# Patient Record
Sex: Female | Born: 1985 | Race: Black or African American | Hispanic: No | Marital: Single | Smoking: Former smoker
Health system: Southern US, Community
[De-identification: ages and names within clinical notes are randomized; demographics above are authoritative.]

## PROBLEM LIST (undated history)

## (undated) ENCOUNTER — Inpatient Hospital Stay: Payer: Self-pay

## (undated) DIAGNOSIS — D649 Anemia, unspecified: Secondary | ICD-10-CM

## (undated) DIAGNOSIS — I639 Cerebral infarction, unspecified: Secondary | ICD-10-CM

## (undated) DIAGNOSIS — I10 Essential (primary) hypertension: Secondary | ICD-10-CM

## (undated) DIAGNOSIS — R42 Dizziness and giddiness: Secondary | ICD-10-CM

---
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
## 2006-05-03 ENCOUNTER — Emergency Department: Payer: Self-pay | Admitting: Emergency Medicine

## 2006-05-10 ENCOUNTER — Emergency Department: Payer: Self-pay | Admitting: Emergency Medicine

## 2006-11-10 ENCOUNTER — Emergency Department: Payer: Self-pay | Admitting: Emergency Medicine

## 2006-11-10 ENCOUNTER — Other Ambulatory Visit: Payer: Self-pay

## 2006-11-11 ENCOUNTER — Emergency Department: Payer: Self-pay | Admitting: Internal Medicine

## 2007-02-11 ENCOUNTER — Emergency Department: Payer: Self-pay | Admitting: Emergency Medicine

## 2007-03-23 ENCOUNTER — Ambulatory Visit: Payer: Self-pay | Admitting: Family Medicine

## 2007-06-30 ENCOUNTER — Observation Stay: Payer: Self-pay | Admitting: Obstetrics and Gynecology

## 2007-07-06 ENCOUNTER — Emergency Department: Payer: Self-pay | Admitting: Internal Medicine

## 2007-07-22 ENCOUNTER — Observation Stay: Payer: Self-pay

## 2007-07-29 ENCOUNTER — Inpatient Hospital Stay: Payer: Self-pay

## 2009-07-10 ENCOUNTER — Emergency Department: Payer: Self-pay | Admitting: Emergency Medicine

## 2012-02-04 ENCOUNTER — Emergency Department (HOSPITAL_COMMUNITY)
Admission: EM | Admit: 2012-02-04 | Discharge: 2012-02-04 | Disposition: A | Discharge status: Home / Self Care | Payer: Medicaid Other | Source: Home / Self Care | Attending: Family Medicine | Admitting: Family Medicine

## 2012-02-04 ENCOUNTER — Encounter (HOSPITAL_COMMUNITY): Payer: Self-pay

## 2012-02-04 DIAGNOSIS — L259 Unspecified contact dermatitis, unspecified cause: Secondary | ICD-10-CM

## 2012-02-04 MED ORDER — MOMETASONE FUROATE 0.1 % EX CREA: TOPICAL_CREAM | Freq: Every day | CUTANEOUS | Status: AC

## 2012-02-04 NOTE — ED Provider Notes (Signed)
History     CSN: [id number]  Arrival date & time 02/04/12  1617   First MD Initiated Contact with Patient 02/04/12 1656      Chief Complaint  Patient presents with  . Rash    (Consider location/radiation/quality/duration/timing/severity/associated sxs/prior treatment) Patient is a 26 y.o. female presenting with rash. The history is provided by the patient.  Rash  This is a new problem. The current episode started 2 days ago. The problem has not changed since onset.The problem is associated with an unknown factor. There has been no fever. The rash is present on the face. The patient is experiencing no pain. Associated symptoms include itching. She has tried nothing for the symptoms.    History reviewed. No pertinent past medical history.  History reviewed. No pertinent past surgical history.  No family history on file.  History  Substance Use Topics  . Smoking status: Current Everyday Smoker  . Smokeless tobacco: Not on file  . Alcohol Use: Yes    OB History    Grav Para Term Preterm Abortions TAB SAB Ect Mult Living                  Review of Systems  Constitutional: Negative.   HENT: Negative.   Skin: Positive for itching and rash.    Allergies  Review of patient's allergies indicates no known allergies.  Home Medications   Current Outpatient Rx  Name Route Sig Dispense Refill  . MOMETASONE FUROATE 0.1 % EX CREA Topical Apply topically daily. 15 g 1    BP 117/59  Pulse 85  Temp(Src) 98.7 F (37.1 C) (Oral)  Resp 18  SpO2 100%  Physical Exam  Nursing note and vitals reviewed. Constitutional: She is oriented to person, place, and time. She appears well-developed and well-nourished.  HENT:  Head: Normocephalic.  Right Ear: External ear normal.  Left Ear: External ear normal.  Nose: Nose normal.  Mouth/Throat: Oropharynx is clear and moist.  Eyes: Conjunctivae are normal. Pupils are equal, round, and reactive to light.  Neck: Normal range of  motion. Neck supple.  Lymphadenopathy:    She has no cervical adenopathy.  Neurological: She is alert and oriented to person, place, and time.  Skin: Rash noted.       Localized fine papular rash to mid forehead and cheeks, sparing remainder of face ,neck and rest of body.    ED Course  Procedures (including critical care time)  Labs Reviewed - No data to display No results found.   1. Contact dermatitis       MDM          Linna Hoff, MD 02/04/12 1843

## 2012-02-04 NOTE — ED Notes (Signed)
C/o itchy rash to face for 2 days. States she used a different soap this weekend.

## 2012-07-11 ENCOUNTER — Emergency Department: Payer: Self-pay | Admitting: Emergency Medicine

## 2012-07-11 LAB — URINALYSIS, COMPLETE
Bilirubin,UR: NEGATIVE
Blood: NEGATIVE
Glucose,UR: NEGATIVE mg/dL (ref 0–75)
Nitrite: NEGATIVE
Ph: 6 (ref 4.5–8.0)
Protein: 100
RBC,UR: NONE SEEN /HPF (ref 0–5)
Specific Gravity: 1.039 (ref 1.003–1.030)
Squamous Epithelial: 27
WBC UR: 4 /HPF (ref 0–5)

## 2012-07-11 LAB — PREGNANCY, URINE: Pregnancy Test, Urine: NEGATIVE m[IU]/mL

## 2012-07-12 LAB — URINE CULTURE

## 2013-11-06 ENCOUNTER — Emergency Department: Payer: Self-pay

## 2014-03-02 ENCOUNTER — Emergency Department: Payer: Self-pay | Admitting: Emergency Medicine

## 2014-03-02 LAB — CBC WITH DIFFERENTIAL/PLATELET
BASOS ABS: 0 10*3/uL (ref 0.0–0.1)
BASOS PCT: 0.5 %
EOS ABS: 0.1 10*3/uL (ref 0.0–0.7)
Eosinophil %: 1.3 %
HCT: 35.1 % (ref 35.0–47.0)
HGB: 11.7 g/dL — AB (ref 12.0–16.0)
LYMPHS PCT: 28 %
Lymphocyte #: 1.8 10*3/uL (ref 1.0–3.6)
MCH: 27.9 pg (ref 26.0–34.0)
MCHC: 33.4 g/dL (ref 32.0–36.0)
MCV: 84 fL (ref 80–100)
MONO ABS: 0.7 x10 3/mm (ref 0.2–0.9)
MONOS PCT: 11.2 %
NEUTROS PCT: 59 %
Neutrophil #: 3.7 10*3/uL (ref 1.4–6.5)
PLATELETS: 152 10*3/uL (ref 150–440)
RBC: 4.19 10*6/uL (ref 3.80–5.20)
RDW: 14.6 % — AB (ref 11.5–14.5)
WBC: 6.3 10*3/uL (ref 3.6–11.0)

## 2014-03-02 LAB — URINALYSIS, COMPLETE
Bacteria: NONE SEEN
Bilirubin,UR: NEGATIVE
Glucose,UR: NEGATIVE mg/dL (ref 0–75)
Ketone: NEGATIVE
LEUKOCYTE ESTERASE: NEGATIVE
NITRITE: NEGATIVE
PH: 6 (ref 4.5–8.0)
PROTEIN: NEGATIVE
Specific Gravity: 1.021 (ref 1.003–1.030)

## 2014-03-02 LAB — COMPREHENSIVE METABOLIC PANEL
ALBUMIN: 3.3 g/dL — AB (ref 3.4–5.0)
ALK PHOS: 63 U/L
ALT: 14 U/L (ref 12–78)
ANION GAP: 2 — AB (ref 7–16)
BILIRUBIN TOTAL: 0.2 mg/dL (ref 0.2–1.0)
BUN: 12 mg/dL (ref 7–18)
CALCIUM: 8.5 mg/dL (ref 8.5–10.1)
CREATININE: 1.03 mg/dL (ref 0.60–1.30)
Chloride: 106 mmol/L (ref 98–107)
Co2: 27 mmol/L (ref 21–32)
EGFR (Non-African Amer.): >60
Glucose: 86 mg/dL (ref 65–99)
Osmolality: 269 (ref 275–301)
Potassium: 3.6 mmol/L (ref 3.5–5.1)
SGOT(AST): 21 U/L (ref 15–37)
Sodium: 135 mmol/L — ABNORMAL LOW (ref 136–145)
Total Protein: 7.3 g/dL (ref 6.4–8.2)

## 2015-10-26 ENCOUNTER — Emergency Department
Admission: EM | Admit: 2015-10-26 | Discharge: 2015-10-26 | Disposition: A | Discharge status: Home / Self Care | Payer: Self-pay | Attending: Emergency Medicine | Admitting: Emergency Medicine

## 2015-10-26 ENCOUNTER — Emergency Department: Payer: Self-pay

## 2015-10-26 ENCOUNTER — Encounter: Payer: Self-pay | Admitting: Emergency Medicine

## 2015-10-26 ENCOUNTER — Emergency Department: Payer: Medicaid Other

## 2015-10-26 DIAGNOSIS — F17200 Nicotine dependence, unspecified, uncomplicated: Secondary | ICD-10-CM | POA: Insufficient documentation

## 2015-10-26 DIAGNOSIS — R42 Dizziness and giddiness: Secondary | ICD-10-CM | POA: Insufficient documentation

## 2015-10-26 DIAGNOSIS — I10 Essential (primary) hypertension: Secondary | ICD-10-CM | POA: Insufficient documentation

## 2015-10-26 DIAGNOSIS — Z3202 Encounter for pregnancy test, result negative: Secondary | ICD-10-CM | POA: Insufficient documentation

## 2015-10-26 HISTORY — DX: Cerebral infarction, unspecified: I63.9

## 2015-10-26 HISTORY — DX: Anemia, unspecified: D64.9

## 2015-10-26 HISTORY — DX: Essential (primary) hypertension: I10

## 2015-10-26 LAB — COMPREHENSIVE METABOLIC PANEL
ALT: 17 U/L (ref 14–54)
AST: 18 U/L (ref 15–41)
Albumin: 4.1 g/dL (ref 3.5–5.0)
Alkaline Phosphatase: 52 U/L (ref 38–126)
Anion gap: 4 — ABNORMAL LOW (ref 5–15)
BUN: 6 mg/dL (ref 6–20)
CHLORIDE: 108 mmol/L (ref 101–111)
CO2: 27 mmol/L (ref 22–32)
CREATININE: 0.77 mg/dL (ref 0.44–1.00)
Calcium: 9 mg/dL (ref 8.9–10.3)
GFR calc Af Amer: >60 mL/min (ref >60)
GFR calc non Af Amer: >60 mL/min (ref >60)
Glucose, Bld: 96 mg/dL (ref 65–99)
Potassium: 4.1 mmol/L (ref 3.5–5.1)
SODIUM: 139 mmol/L (ref 135–145)
Total Bilirubin: 0.3 mg/dL (ref 0.3–1.2)
Total Protein: 7.8 g/dL (ref 6.5–8.1)

## 2015-10-26 LAB — CBC WITH DIFFERENTIAL/PLATELET
Basophils Absolute: 0 10*3/uL (ref 0–0.1)
Basophils Relative: 1 %
Eosinophils Absolute: 0 10*3/uL (ref 0–0.7)
Eosinophils Relative: 1 %
HCT: 38.9 % (ref 35.0–47.0)
Hemoglobin: 12.7 g/dL (ref 12.0–16.0)
Lymphocytes Relative: 33 %
Lymphs Abs: 1.4 10*3/uL (ref 1.0–3.6)
MCH: 27.5 pg (ref 26.0–34.0)
MCHC: 32.6 g/dL (ref 32.0–36.0)
MCV: 84.2 fL (ref 80.0–100.0)
Monocytes Absolute: 0.4 10*3/uL (ref 0.2–0.9)
Monocytes Relative: 10 %
Neutro Abs: 2.3 10*3/uL (ref 1.4–6.5)
Neutrophils Relative %: 55 %
Platelets: 192 10*3/uL (ref 150–440)
RBC: 4.62 MIL/uL (ref 3.80–5.20)
RDW: 14.8 % — ABNORMAL HIGH (ref 11.5–14.5)
WBC: 4.2 10*3/uL (ref 3.6–11.0)

## 2015-10-26 LAB — URINE DRUG SCREEN, QUALITATIVE (ARMC ONLY)
Amphetamines, Ur Screen: NOT DETECTED
BARBITURATES, UR SCREEN: NOT DETECTED
Benzodiazepine, Ur Scrn: NOT DETECTED
COCAINE METABOLITE, UR ~~LOC~~: NOT DETECTED
Cannabinoid 50 Ng, Ur ~~LOC~~: NOT DETECTED
MDMA (Ecstasy)Ur Screen: NOT DETECTED
METHADONE SCREEN, URINE: NOT DETECTED
OPIATE, UR SCREEN: NOT DETECTED
Phencyclidine (PCP) Ur S: NOT DETECTED
Tricyclic, Ur Screen: NOT DETECTED

## 2015-10-26 LAB — URINALYSIS COMPLETE WITH MICROSCOPIC (ARMC ONLY)
Bacteria, UA: NONE SEEN
Bilirubin Urine: NEGATIVE
Glucose, UA: NEGATIVE mg/dL
Hgb urine dipstick: NEGATIVE
Ketones, ur: NEGATIVE mg/dL
Leukocytes, UA: NEGATIVE
Nitrite: NEGATIVE
Protein, ur: 30 mg/dL — AB
Specific Gravity, Urine: 1.029 (ref 1.005–1.030)
pH: 7 (ref 5.0–8.0)

## 2015-10-26 LAB — ETHANOL: Alcohol, Ethyl (B): <5 mg/dL (ref <5)

## 2015-10-26 LAB — GLUCOSE, CAPILLARY: Glucose-Capillary: 73 mg/dL (ref 65–99)

## 2015-10-26 LAB — POCT PREGNANCY, URINE: Preg Test, Ur: NEGATIVE

## 2015-10-26 MED ORDER — SODIUM CHLORIDE 0.9 % IV BOLUS (SEPSIS)
1000.0000 mL | Freq: Once | INTRAVENOUS | Status: AC
  Administered 2015-10-26: 1000 mL via INTRAVENOUS

## 2015-10-26 MED ORDER — NITROGLYCERIN 0.4 MG SL SUBL
SUBLINGUAL_TABLET | SUBLINGUAL | Status: AC
  Filled 2015-10-26: qty 1

## 2015-10-26 MED ORDER — MECLIZINE HCL 50 MG PO TABS: 25.0000 mg | ORAL_TABLET | Freq: Three times a day (TID) | ORAL | Status: DC | PRN

## 2015-10-26 MED ORDER — LORAZEPAM 2 MG/ML IJ SOLN
1.0000 mg | Freq: Once | INTRAMUSCULAR | Status: AC
  Administered 2015-10-26: 1 mg via INTRAVENOUS
  Filled 2015-10-26: qty 1

## 2015-10-26 MED ORDER — MECLIZINE HCL 25 MG PO TABS
25.0000 mg | ORAL_TABLET | Freq: Once | ORAL | Status: AC
  Administered 2015-10-26: 25 mg via ORAL
  Filled 2015-10-26: qty 1

## 2015-10-26 MED ORDER — ONDANSETRON HCL 4 MG PO TABS: 4.0000 mg | ORAL_TABLET | Freq: Every day | ORAL | Status: DC | PRN

## 2015-10-26 NOTE — ED Provider Notes (Addendum)
[facility] Emergency Department Provider Note  ____________________________________________   I have reviewed the triage vital signs and the nursing notes.   HISTORY  Chief Complaint Near Syncope    HPI Bernardette Rifky Hines is a 30 y.o. 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 Patient states that she woke up this morning feeling lightheaded. She actually states that she had true vertigo. The room was spinning. She is currently on her menses and has heavy menstrual periods. She does have a history of anemia. She denies any significant abdominal pain. Denies any chest pain or shortness of breath. She has had some nausea this morning with the vertigo. She has not had vertigo before but she has a true sensation of spinning. She denies any focal numbness or weakness aside from her baseline left-sided weakness which she states is always there little bit. She does not feel otherwise unwell. She felt fine last night.  Past Medical History  Diagnosis Date  . Stroke (HCC)   . Hypertension   . Anemia     There are no active problems to display for this patient.   History reviewed. No pertinent past surgical history.  No current outpatient prescriptions on file.  Allergies Review of patient's allergies indicates no known allergies.  No family history on file.  Social History Social History  Substance Use Topics  . Smoking status: Current Every Day Smoker  . Smokeless tobacco: None  . Alcohol Use: Yes    Review of Systems Constitutional: No fever/chills Eyes: No visual changes. ENT: No sore throat. No stiff neck no neck pain Cardiovascular: Denies chest pain. Respiratory: Denies shortness of breath. Gastrointestinal:   no vomiting.  No diarrhea.  No constipation. Genitourinary: Negative for  dysuria. Musculoskeletal: Negative lower extremity swelling Skin: Negative for rash. Neurological: Negative for headaches, focal weakness or numbness. 10-point ROS otherwise negative.  ____________________________________________   PHYSICAL EXAM:  VITAL SIGNS: ED Triage Vitals  Enc Vitals Group     BP --      Pulse Rate 10/26/15 1020 71     Resp 10/26/15 1020 20     Temp 10/26/15 1020 98.2 F (36.8 C)     Temp Source 10/26/15 1020 Oral     SpO2 10/26/15 1020 98 %     Weight --      Height --      Head Cir --      Peak Flow --      Pain Score --      Pain Loc --      Pain Edu? --      Excl. in GC? --     Constitutional: Alert and oriented. Well appearing and in no acute distress. Eyes: Conjunctivae are normal. PERRL. EOMI. Head: Atraumatic. Nose: No congestion/rhinnorhea. Mouth/Throat: Mucous membranes are moist.  Oropharynx non-erythematous. Neck: No stridor.   Nontender with no meningismus Cardiovascular: Normal rate, regular rhythm. Grossly normal heart sounds.  Good peripheral circulation. Respiratory: Normal respiratory effort.  No retractions. Lungs CTAB. Abdominal: Soft and nontender. No distention. No guarding no rebound Back:  There is no focal tenderness or step off there is no midline tenderness there are no lesions noted. there is no CVA tenderness  Musculoskeletal: No lower extremity tenderness. No joint effusions, no DVT signs strong distal pulses no edema Neurologic:  Renal nerves II through XII are  grossly intact she has normal speech and language, she has slightly less strength in left upper and left lower extremity than on the right however she states this is her baseline. Strength on that side is 5 out of 5 but there is appreciated very slight difference. Finger-nose and the right is normal. Some effort-related concerns exist for this neurologic exam Skin:  Skin is warm, dry and intact. No rash noted. Psychiatric: Mood and affect are normal. Speech and  behavior are normal.  ____________________________________________   LABS (all labs ordered are listed, but only abnormal results are displayed)  Labs Reviewed  URINE DRUG SCREEN, QUALITATIVE ([facility] ONLY)  CBC WITH DIFFERENTIAL/PLATELET  URINALYSIS COMPLETEWITH MICROSCOPIC ([facility] ONLY)  COMPREHENSIVE METABOLIC PANEL  ETHANOL  CBG MONITORING, ED  POC URINE PREG, ED   ____________________________________________  EKG  I personally interpreted any EKGs ordered by me or triage Sinus rhythm rate 82 bpm no acute ST elevation or acute ST depression nonspecific ST changes noted ____________________________________________  RADIOLOGY  I reviewed any imaging ordered by me or triage that were performed during my shift ____________________________________________   PROCEDURES  Procedure(s) performed: None  Critical Care performed: None  ____________________________________________   INITIAL IMPRESSION / ASSESSMENT AND PLAN / ED COURSE  Pertinent labs & imaging results that were available during my care of the patient were reviewed by me and considered in my medical decision making (see chart for details).  Patient presents today with true vertigo, sensation of spinning, she does have baseline left-sided weakness which is noted however it is unclear if this is possibly worse. Patient denies any worsening of the symptoms. Her chief complaint is true vertigo. Patient states she woke up with this. Even if this were essentially mediated CVA, patient would not be a candidate for TPA as the time of onset is unknown. We'll obtain a CT scan of her head to further evaluate this, I will give her anti-vertigo medications in hopes of helping her symptom logically and will reassess.  Marland Kitchen----------------------------------------- 12:21 PM on 10/26/2015 -----------------------------------------  Patient in no acute distress, at this time her neurologic exam is normal with grip strength and leg  raising seeming to be equal. Cranial nerves remain intact. Speech is normal. She states her vertigo was much better after the Antivert. It is unclear if the patient truly had left sided weakness or was effort related but we will assume of course that it was real. We will obtain a MRI of her head for this reason. Patient states she has a history of CVA in the past but she has had a negative CT here. I did discuss with radiology they think a noncontrasted CT of the head to be sufficient to rule out either posterior fossa stroke or old stroke. Patient at this time has no complaints unless she closes her eyes and moves her head. She is requesting anti-anxiety medication prior to MRI. She is also hungry. Patient sitting in the room with her legs crossed in no acute distress.   ----------------------------------------- 1:37 PM on 10/26/2015 -----------------------------------------  MRI shows no evidence of acute or prior CVA. Patient feels much better, we'll try by mouth challenge and reassess.  ----------------------------------------- 2:47 PM on 10/26/2015 -----------------------------------------  States her symptoms are completely gone ambulate with no difficulty no evidence of acute CVA no evidence of prior CVA on MRI, neurologic exam remains completely normal. Patient declines any further evaluation or admission she is eager to go home she would like a work note however. We will refer her  to neurology, return precautions and follow-up given and understood. ____________________________________________   FINAL CLINICAL IMPRESSION(S) / ED DIAGNOSES  Final diagnoses:  None      This chart was dictated using voice recognition software.  Despite best efforts to proofread,  errors can occur which can change meaning.     Jeanmarie Plant, MD 10/26/15 1049  Jeanmarie Plant, MD 10/26/15 1222  Jeanmarie Plant, MD 10/26/15 1337  Jeanmarie Plant, MD 10/26/15 [phone]

## 2015-10-26 NOTE — ED Notes (Signed)
Pt to MRI

## 2015-10-26 NOTE — ED Notes (Signed)
Report given to Jerrie, RN.  

## 2015-10-26 NOTE — ED Notes (Addendum)
Pt to ed via ems from work with reports of becoming dizzy, lightheaded. Ems reports negative stroke screen upon assessment. cbg 101, nsr on monitor. Pt is supposed to be taking blood pressure medicine but does not take any. Pt with hx of stroke back last year in May with some left sided deficits. No acute distress noted upon arrival to ed. Protocols initiated.   Pt does state that she  Is currently on her menstrual cycle and her flow is very heavy.

## 2015-10-26 NOTE — ED Notes (Signed)
Patient transported to CT 

## 2015-11-24 ENCOUNTER — Encounter: Payer: Self-pay | Admitting: Emergency Medicine

## 2015-11-24 ENCOUNTER — Emergency Department
Admission: EM | Admit: 2015-11-24 | Discharge: 2015-11-24 | Disposition: A | Discharge status: Home / Self Care | Payer: Medicaid Other | Attending: Student | Admitting: Student

## 2015-11-24 DIAGNOSIS — R5383 Other fatigue: Secondary | ICD-10-CM | POA: Insufficient documentation

## 2015-11-24 DIAGNOSIS — R0602 Shortness of breath: Secondary | ICD-10-CM | POA: Insufficient documentation

## 2015-11-24 DIAGNOSIS — Z3202 Encounter for pregnancy test, result negative: Secondary | ICD-10-CM | POA: Insufficient documentation

## 2015-11-24 DIAGNOSIS — R42 Dizziness and giddiness: Secondary | ICD-10-CM | POA: Insufficient documentation

## 2015-11-24 DIAGNOSIS — F17210 Nicotine dependence, cigarettes, uncomplicated: Secondary | ICD-10-CM | POA: Insufficient documentation

## 2015-11-24 DIAGNOSIS — R531 Weakness: Secondary | ICD-10-CM | POA: Insufficient documentation

## 2015-11-24 DIAGNOSIS — I10 Essential (primary) hypertension: Secondary | ICD-10-CM | POA: Insufficient documentation

## 2015-11-24 DIAGNOSIS — R51 Headache: Secondary | ICD-10-CM | POA: Insufficient documentation

## 2015-11-24 DIAGNOSIS — R109 Unspecified abdominal pain: Secondary | ICD-10-CM | POA: Insufficient documentation

## 2015-11-24 DIAGNOSIS — R519 Headache, unspecified: Secondary | ICD-10-CM

## 2015-11-24 DIAGNOSIS — R202 Paresthesia of skin: Secondary | ICD-10-CM | POA: Insufficient documentation

## 2015-11-24 HISTORY — DX: Dizziness and giddiness: R42

## 2015-11-24 LAB — CBC WITH DIFFERENTIAL/PLATELET
BASOS ABS: 0 10*3/uL (ref 0–0.1)
BASOS PCT: 1 %
EOS ABS: 0 10*3/uL (ref 0–0.7)
Eosinophils Relative: 1 %
HCT: 37.9 % (ref 35.0–47.0)
HEMOGLOBIN: 12.6 g/dL (ref 12.0–16.0)
LYMPHS ABS: 1.7 10*3/uL (ref 1.0–3.6)
Lymphocytes Relative: 46 %
MCH: 28 pg (ref 26.0–34.0)
MCHC: 33.4 g/dL (ref 32.0–36.0)
MCV: 83.8 fL (ref 80.0–100.0)
Monocytes Absolute: 0.4 10*3/uL (ref 0.2–0.9)
Monocytes Relative: 10 %
NEUTROS PCT: 42 %
Neutro Abs: 1.6 10*3/uL (ref 1.4–6.5)
Platelets: 183 10*3/uL (ref 150–440)
RBC: 4.52 MIL/uL (ref 3.80–5.20)
RDW: 14.6 % — ABNORMAL HIGH (ref 11.5–14.5)
WBC: 3.8 10*3/uL (ref 3.6–11.0)

## 2015-11-24 LAB — BASIC METABOLIC PANEL
Anion gap: 6 (ref 5–15)
BUN: 8 mg/dL (ref 6–20)
CHLORIDE: 108 mmol/L (ref 101–111)
CO2: 26 mmol/L (ref 22–32)
Calcium: 9 mg/dL (ref 8.9–10.3)
Creatinine, Ser: 0.91 mg/dL (ref 0.44–1.00)
GFR calc non Af Amer: >60 mL/min (ref >60)
Glucose, Bld: 97 mg/dL (ref 65–99)
POTASSIUM: 3.9 mmol/L (ref 3.5–5.1)
SODIUM: 140 mmol/L (ref 135–145)

## 2015-11-24 LAB — POCT PREGNANCY, URINE: PREG TEST UR: NEGATIVE

## 2015-11-24 MED ORDER — SODIUM CHLORIDE 0.9 % IV BOLUS (SEPSIS)
1000.0000 mL | Freq: Once | INTRAVENOUS | Status: AC
  Administered 2015-11-24: 1000 mL via INTRAVENOUS

## 2015-11-24 MED ORDER — DIPHENHYDRAMINE HCL 50 MG/ML IJ SOLN
12.5000 mg | Freq: Once | INTRAMUSCULAR | Status: AC
  Administered 2015-11-24: 12.5 mg via INTRAVENOUS
  Filled 2015-11-24: qty 1

## 2015-11-24 MED ORDER — METOCLOPRAMIDE HCL 5 MG/ML IJ SOLN
10.0000 mg | Freq: Once | INTRAMUSCULAR | Status: AC
  Administered 2015-11-24: 10 mg via INTRAVENOUS
  Filled 2015-11-24: qty 2

## 2015-11-24 MED ORDER — KETOROLAC TROMETHAMINE 30 MG/ML IJ SOLN
15.0000 mg | Freq: Once | INTRAMUSCULAR | Status: AC
  Administered 2015-11-24: 15 mg via INTRAVENOUS
  Filled 2015-11-24: qty 1

## 2015-11-24 MED ORDER — MECLIZINE HCL 25 MG PO TABS
25.0000 mg | ORAL_TABLET | Freq: Once | ORAL | Status: AC
  Administered 2015-11-24: 25 mg via ORAL
  Filled 2015-11-24: qty 1

## 2015-11-24 MED ORDER — MECLIZINE HCL 12.5 MG PO TABS: 12.5000 mg | ORAL_TABLET | Freq: Three times a day (TID) | ORAL | Status: DC | PRN

## 2015-11-24 MED ORDER — ONDANSETRON HCL 4 MG/2ML IJ SOLN
4.0000 mg | Freq: Once | INTRAMUSCULAR | Status: AC
  Administered 2015-11-24: 4 mg via INTRAVENOUS
  Filled 2015-11-24: qty 2

## 2015-11-24 MED ORDER — ONDANSETRON 4 MG PO TBDP: 4.0000 mg | ORAL_TABLET | Freq: Once | ORAL | Status: DC

## 2015-11-24 NOTE — ED Provider Notes (Addendum)
[facility] Emergency Department Provider Note  ____________________________________________  Time seen: Approximately 12:33 PM  I have reviewed the triage vital signs and the nursing notes.   HISTORY  Chief Complaint Dizziness    HPI Cheryl Hines is a 30 y.o. female with reported history of CVA with left-sided numbness and weakness, hypertension, vertigo who presents for evaluation of room spinning dizziness, intermittent since yesterday, worse when she stands or sits, improves with lying down and being still, associated with some nausea, currently moderate to severe. Patient reports that she has had these symptoms previously in the setting of vertigo. She had no meclizine to take. She is also complaining of right-sided headache reports "but I have headaches". No vision change, vomiting, neck pain or neck stiffness, no fevers. No chest pain or difficulty breathing. No recent trauma. No increase in weakness or numbness in the left side. She reports that one of her family members might have had a stroke in her 74s and died of "unnatural causes".   Past Medical History  Diagnosis Date  . Stroke (HCC)   . Hypertension   . Anemia   . Vertigo     There are no active problems to display for this patient.   No past surgical history on file.  Current Outpatient Rx  Name  Route  Sig  Dispense  Refill  . ibuprofen (ADVIL,MOTRIN) 200 MG tablet   Oral   Take 400 mg by mouth every 6 (six) hours as needed for headache (migraines.).         Marland Kitchen meclizine (ANTIVERT) 50 MG tablet   Oral   Take 0.5 tablets (25 mg total) by mouth 3 (three) times daily as needed.   30 tablet   0   . ondansetron (ZOFRAN) 4 MG tablet   Oral   Take 1 tablet (4 mg total) by mouth daily as needed for nausea or vomiting.   10 tablet   0     Allergies Review of patient's allergies indicates no known allergies.  No family history on file.  Social History Social  History  Substance Use Topics  . Smoking status: Current Every Day Smoker -- 0.50 packs/day    Types: Cigarettes  . Smokeless tobacco: None  . Alcohol Use: Yes    Review of Systems Constitutional: No fever/chills Eyes: No visual changes. ENT: No sore throat. Cardiovascular: Denies chest pain. Respiratory: Denies shortness of breath. Gastrointestinal: No abdominal pain.  No nausea, no vomiting.  No diarrhea.  No constipation. Genitourinary: Negative for dysuria. Musculoskeletal: Negative for back pain. Skin: Negative for rash. Neurological: Positive for headaches,  Chronic focal weakness or numbness.  10-point ROS otherwise negative.  ____________________________________________   PHYSICAL EXAM:  VITAL SIGNS: ED Triage Vitals  Enc Vitals Group     BP 11/24/15 1140 131/79 mmHg     Pulse Rate 11/24/15 1140 63     Resp 11/24/15 1140 18     Temp 11/24/15 1140 97.8 F (36.6 C)     Temp Source 11/24/15 1140 Oral     SpO2 11/24/15 1140 96 %     Weight 11/24/15 1140 215 lb (97.523 kg)     Height 11/24/15 1140  (1.676 m)     Head Cir --      Peak Flow --      Pain Score 11/24/15 1141 10     Pain Loc --      Pain Edu? --      Excl. in GC? --  Constitutional: Alert and oriented. Well appearing and in no acute distress. Eyes: Conjunctivae are normal. PERRL. EOMI. Head: Atraumatic. Nose: No congestion/rhinnorhea. Mouth/Throat: Mucous membranes are moist.  Oropharynx non-erythematous. Neck: No stridor.  Supple without meningismus.  Cardiovascular: Normal rate, regular rhythm. Grossly normal heart sounds.  Good peripheral circulation. Respiratory: Normal respiratory effort.  No retractions. Lungs CTAB. Gastrointestinal: Soft and nontender. No distention. No abdominal bruits. No CVA tenderness. Genitourinary: Deferred Musculoskeletal: No lower extremity tenderness nor edema.  No joint effusions. Neurologic:  Normal speech and language. 5 out of 5 strength in the right  upper and lower extremity, sensation intact to light touch in the right upper extremity, right lower extremity, left upper extremity. Very minimally diminished grip strength in the left hand however this improves to full strength with encouragement and there is 5 out of 5 strength of flexion and extension at the left elbow and wrist, 5 out of 5 strength in the left lower extremity however decreased sensation to light touch in the left lower extremity. Cranial nerves II through XII intact. Normal finger-nose-finger. She does become symptomatic with dizziness upon sitting forward. Skin:  Skin is warm, dry and intact. No rash noted. Psychiatric: Mood and affect are normal. Speech and behavior are normal.  ____________________________________________   LABS (all labs ordered are listed, but only abnormal results are displayed)  Labs Reviewed  CBC WITH DIFFERENTIAL/PLATELET - Abnormal; Notable for the following:    RDW 14.6 (*)    All other components within normal limits  BASIC METABOLIC PANEL  POC URINE PREG, ED  POCT PREGNANCY, URINE   ____________________________________________  EKG  none ____________________________________________  RADIOLOGY  none ____________________________________________   PROCEDURES  Procedure(s) performed: None  Critical Care performed: No  ____________________________________________   INITIAL IMPRESSION / ASSESSMENT AND PLAN / ED COURSE  Pertinent labs & imaging results that were available during my care of the patient were reviewed by me and considered in my medical decision making (see chart for details).  Cheryl Hines is a 30 y.o. female with reported history of CVA with left-sided numbness and weakness, hypertension, vertigo who presents for evaluation of room spinning dizziness. On exam, she is nontoxic appearing and in no acute distress. Vital signs are stable, she is afebrile. She appears to have intact strength though had  some very faint weakness with grip strength on initial examination which normalized with encouragement and I suspect this was effort dependent. She does have some decreased sensation to light touch in the left leg but she reports it is ongoing for greater than a year/is chronic. The remainder of her neurological exam is normal. She does become symptomatic with movement/position change and with the finger-nose-finger examination and I suspect that this represents vertigo. Of note, just one month ago she was seen in this ER with similar symptoms and she had a negative CT head as well as a negative MRI that did not show any evidence of a prior stroke. I doubt that her symptoms today represent acute CVA. We'll obtain screening labs, treat her symptomatically as this likely represents for vertigo versus possibly an atypical migraine headache given her mild right-sided headache. Not consistent with subarachnoid hemorrhage or meningitis. Reassess for disposition.  ----------------------------------------- 2:59 PM on 11/24/2015 ----------------------------------------- Labs unremarkable. Negative pregnancy test. Patient is awake, alert, texting on her phone, sipping from a cup of water at the bedside. Ambulates well. Neurological exam is unchanged from prior. She reports resolution of her headache and dizziness at this time. Suspect peripheral  vertigo. We discussed return precautions, need for close neurology follow-up for her chronic paresthesias in the left leg as well as her vertigo. She is comfortable with the discharge plan. DC home with meclizine. She is requesting a work note which I will provide. ____________________________________________   FINAL CLINICAL IMPRESSION(S) / ED DIAGNOSES  Final diagnoses:  Dizziness  Acute nonintractable headache, unspecified headache type  Paresthesia of lower extremity      Gayla Doss, MD 11/24/15 1501  Gayla Doss, MD 11/24/15 1505

## 2015-11-24 NOTE — ED Provider Notes (Signed)
[facility] Emergency Department Provider Note  ____________________________________________  Time seen: Approximately 12:09 PM  I have reviewed the triage vital signs and the nursing notes.   HISTORY  Chief Complaint Dizziness    HPI Bernardette Sarajane Fambrough is a 30 y.o. female , NAD, presents emergency department with 24-hour history of dizziness, head spinning that began yesterday.Today while at work she had right-sided headache, left-sided weakness and tingling. Also notes abdominal pain and shortness of breath of sudden onset while working. Does have a history of CVA a few years ago and notes some residual left-sided weakness although the tingling is new today. His not know that this is the worst headache of her life. Has not had any fevers, chills, body aches. No particular upper respiratory symptoms other than right-sided sinus pressure. Denies LOC, head injuries, falls.   Past Medical History  Diagnosis Date  . Stroke (HCC)   . Hypertension   . Anemia   . Vertigo     There are no active problems to display for this patient.   No past surgical history on file.  Current Outpatient Rx  Name  Route  Sig  Dispense  Refill  . meclizine (ANTIVERT) 50 MG tablet   Oral   Take 0.5 tablets (25 mg total) by mouth 3 (three) times daily as needed.   30 tablet   0   . ondansetron (ZOFRAN) 4 MG tablet   Oral   Take 1 tablet (4 mg total) by mouth daily as needed for nausea or vomiting.   10 tablet   0     Allergies Review of patient's allergies indicates no known allergies.  No family history on file.  Social History Social History  Substance Use Topics  . Smoking status: Current Every Day Smoker -- 0.50 packs/day    Types: Cigarettes  . Smokeless tobacco: None  . Alcohol Use: Yes     Review of Systems  Constitutional: Positive fatigue. Positive room spinning dizziness. No fever/chills Eyes: No visual changes.  ENT:  Right-sided  sinus pressure. No sore throat, ear pain, nasal congestion. Cardiovascular: No chest pain. Respiratory: No cough. Positive shortness of breath. No wheezing.  Gastrointestinal: Positive abdominal pain.  No nausea, vomiting.  No diarrhea.  No constipation. Genitourinary: Negative for dysuria. No hematuria. No urinary hesitancy, urgency or increased frequency. Musculoskeletal: Negative for back pain, neck pain.  Skin: Negative for rash. Neurological:  Positive for  right-sidedheadache,  with left-sided upper and lower extremity weakness and tingling. 10-point ROS otherwise negative.  ____________________________________________   PHYSICAL EXAM:  VITAL SIGNS: ED Triage Vitals  Enc Vitals Group     BP 11/24/15 1140 131/79 mmHg     Pulse Rate 11/24/15 1140 63     Resp 11/24/15 1140 18     Temp 11/24/15 1140 97.8 F (36.6 C)     Temp Source 11/24/15 1140 Oral     SpO2 11/24/15 1140 96 %     Weight 11/24/15 1140 215 lb (97.523 kg)     Height 11/24/15 1140  (1.676 m)     Head Cir --      Peak Flow --      Pain Score 11/24/15 1141 10     Pain Loc --      Pain Edu? --      Excl. in GC? --     Constitutional: Alert and oriented. Well appearing and in no acute distress. Eyes: Conjunctivae are normal. PERRL. EOMI without pain.  Head:  Atraumatic. No evidence of facial droop. ENT:      Ears:  TMs visualized bilaterally with mild injection but no effusion, bulging, perforation.      Nose: No congestion/rhinnorhea.      Mouth/Throat: Mucous membranes are moist.  Nares without erythema, swelling, exudate and no postnasal drip Neck:  Supple with full range of motion Hematological/Lymphatic/Immunilogical: No cervical lymphadenopathy. Cardiovascular: Normal rate, regular rhythm. Normal S1 and S2.  Good peripheral circulation. Respiratory: Normal respiratory effort without tachypnea or retractions. Lungs CTAB. Neurologic:  Normal speech and language.  Weakend left upper extremity grip  strength. Normal right upper extremity grip strength. Normal lower extremity strengthNo gross focal neurologic deficits are appreciated.  Skin:  Skin is warm, dry and intact. No rash noted. Psychiatric: Mood and affect are normal. Speech and behavior are normal. Patient exhibits appropriate insight and judgement.   ____________________________________________   LABS (all labs ordered are listed, but only abnormal results are displayed)  Labs Reviewed - No data to display ____________________________________________  EKG  None ____________________________________________  RADIOLOGY  ____________________________________________    PROCEDURES  Procedure(s) performed: None      Medications - No data to display   ____________________________________________   INITIAL IMPRESSION / ASSESSMENT AND PLAN / ED COURSE  Patient is currently being moved to room 24 to be under the care of Dr. Toney RakesEryka Gayle, who has been apprised of the patient's current presentation.      ____________________________________________  FINAL CLINICAL IMPRESSION(S) / ED DIAGNOSES  Final diagnoses:  None      NEW MEDICATIONS STARTED DURING THIS VISIT:  New Prescriptions   No medications on file         Hope PigeonJami L Hagler, PA-C 11/24/15 1224  Gayla DossEryka A Gayle, MD 11/24/15 1606

## 2015-11-24 NOTE — ED Notes (Signed)
Feels like "head spinning" intermittent since yesterday. Afebrile. History of vertigo. No fall or head injury.

## 2016-04-06 ENCOUNTER — Other Ambulatory Visit: Payer: Self-pay

## 2016-04-06 ENCOUNTER — Emergency Department: Payer: Medicaid Other

## 2016-04-06 ENCOUNTER — Encounter: Payer: Self-pay | Admitting: Emergency Medicine

## 2016-04-06 ENCOUNTER — Emergency Department
Admission: EM | Admit: 2016-04-06 | Discharge: 2016-04-06 | Disposition: A | Discharge status: Home / Self Care | Payer: Medicaid Other | Attending: Emergency Medicine | Admitting: Emergency Medicine

## 2016-04-06 DIAGNOSIS — Z5321 Procedure and treatment not carried out due to patient leaving prior to being seen by health care provider: Secondary | ICD-10-CM | POA: Insufficient documentation

## 2016-04-06 DIAGNOSIS — R079 Chest pain, unspecified: Secondary | ICD-10-CM | POA: Insufficient documentation

## 2016-04-06 LAB — BASIC METABOLIC PANEL
Anion gap: 10 (ref 5–15)
BUN: 6 mg/dL (ref 6–20)
CHLORIDE: 105 mmol/L (ref 101–111)
CO2: 22 mmol/L (ref 22–32)
Calcium: 8.8 mg/dL — ABNORMAL LOW (ref 8.9–10.3)
Creatinine, Ser: 0.72 mg/dL (ref 0.44–1.00)
GFR calc Af Amer: >60 mL/min (ref >60)
GFR calc non Af Amer: >60 mL/min (ref >60)
Glucose, Bld: 101 mg/dL — ABNORMAL HIGH (ref 65–99)
POTASSIUM: 3.5 mmol/L (ref 3.5–5.1)
SODIUM: 137 mmol/L (ref 135–145)

## 2016-04-06 LAB — CBC
HEMATOCRIT: 37.7 % (ref 35.0–47.0)
Hemoglobin: 12.9 g/dL (ref 12.0–16.0)
MCH: 28.6 pg (ref 26.0–34.0)
MCHC: 34.1 g/dL (ref 32.0–36.0)
MCV: 83.8 fL (ref 80.0–100.0)
Platelets: 183 10*3/uL (ref 150–440)
RBC: 4.5 MIL/uL (ref 3.80–5.20)
RDW: 15.1 % — AB (ref 11.5–14.5)
WBC: 6.1 10*3/uL (ref 3.6–11.0)

## 2016-04-06 LAB — TROPONIN I: Troponin I: <0.03 ng/mL (ref <0.03)

## 2016-04-06 NOTE — ED Notes (Signed)
Pt states has been under stress with multiple stressors. Pt states began to have central chest pain yesterday with radiation to arms, back, neck, jaw with nausea. Pt states tonight after verbal argument began to have increased pain with associated shob. Pt arrived to triage very tearful, hyperventilating, calms with reassurance. Skin normal color warm and dry.

## 2016-04-19 NOTE — ED Provider Notes (Signed)
[facility] Emergency Department Provider Note  ____________________________________________  Time seen: 2:00 AM  I have reviewed the triage vital signs and the nursing notes.   HISTORY  Chief Complaint Chest Pain     HPI Cheryl Hines is a 30 y.o. female with history of hypertension and anemia presents with central chest pain with onset yesterday with radiation to bilateral arms back and neck. Patient states pain began tonight after verbal altercation.   Past Medical History:  Diagnosis Date  . Anemia   . Hypertension   . Stroke (HCC)   . Vertigo     There are no active problems to display for this patient.   No past surgical history on file.  Prior to Admission medications   Medication Sig Start Date End Date Taking? Authorizing Provider  ibuprofen (ADVIL,MOTRIN) 200 MG tablet Take 400 mg by mouth every 6 (six) hours as needed for headache (migraines.).    Historical Provider, MD  meclizine (ANTIVERT) 12.5 MG tablet Take 1 tablet (12.5 mg total) by mouth every 8 (eight) hours as needed for dizziness or nausea. 11/24/15   Gayla Doss, MD  meclizine (ANTIVERT) 50 MG tablet Take 0.5 tablets (25 mg total) by mouth 3 (three) times daily as needed. 10/26/15   Jeanmarie Plant, MD  ondansetron (ZOFRAN) 4 MG tablet Take 1 tablet (4 mg total) by mouth daily as needed for nausea or vomiting. 10/26/15   Jeanmarie Plant, MD    Allergies No known drug allergies No family history on file.  Social History Social History  Substance Use Topics  . Smoking status: Current Every Day Smoker    Packs/day: 0.50    Types: Cigarettes  . Smokeless tobacco: Never Used  . Alcohol use Yes    Review of Systems  Constitutional: Negative for fever. Eyes: Negative for visual changes. ENT: Negative for sore throat. Cardiovascular: Positive for chest pain. Respiratory: Negative for shortness of breath. Gastrointestinal: Negative for abdominal pain,  vomiting and diarrhea. Genitourinary: Negative for dysuria. Musculoskeletal: Negative for back pain. Skin: Negative for rash. Neurological: Negative for headaches, focal weakness or numbness.   10-point ROS otherwise negative.  ____________________________________________   PHYSICAL EXAM:  VITAL SIGNS: ED Triage Vitals [04/06/16 0058]  Enc Vitals Group     BP 124/76     Pulse Rate 93     Resp 20     Temp 99 F (37.2 C)     Temp Source Oral     SpO2 100 %     Weight 215 lb (97.5 kg)     Height 5\' 6"  (1.676 m)     Head Circumference      Peak Flow      Pain Score 7     Pain Loc      Pain Edu?      Excl. in GC?      Constitutional: Alert and oriented. Well appearing and in no distress. Eyes: Conjunctivae are normal. PERRL. Normal extraocular movements. ENT   Head: Normocephalic and atraumatic.   Nose: No congestion/rhinnorhea.   Mouth/Throat: Mucous membranes are moist.   Neck: No stridor. Hematological/Lymphatic/Immunilogical: No cervical lymphadenopathy. Cardiovascular: Normal rate, regular rhythm. Normal and symmetric distal pulses are present in all extremities. No murmurs, rubs, or gallops. Respiratory: Normal respiratory effort without tachypnea nor retractions. Breath sounds are clear and equal bilaterally. No wheezes/rales/rhonchi. Gastrointestinal: Soft and nontender. No distention. There is no CVA tenderness. Genitourinary: deferred Musculoskeletal: Nontender with normal range of motion in  all extremities. No joint effusions.  No lower extremity tenderness nor edema. Neurologic:  Normal speech and language. No gross focal neurologic deficits are appreciated. Speech is normal.  Skin:  Skin is warm, dry and intact. No rash noted. Psychiatric: Mood and affect are normal. Speech and behavior are normal. Patient exhibits appropriate insight and judgment.  ____________________________________________    LABS (pertinent positives/negatives)  Labs  Reviewed  BASIC METABOLIC PANEL - Abnormal; Notable for the following:       Result Value   Glucose, Bld 101 (*)    Calcium 8.8 (*)    All other components within normal limits  CBC - Abnormal; Notable for the following:    RDW 15.1 (*)    All other components within normal limits  TROPONIN I     ____________________________________________   EKG  ED ECG REPORT I, [place], the attending physician, personally viewed and interpreted this ECG.   Date: 04/19/2016  EKG Time: 12:41 AM  Rate: 95  Rhythm: Normal sinus rhythm  Axis: Normal  Intervals: Normal  ST&T Change: None   ____________________________________________    RADIOLOGY  CLINICAL DATA:  Central chest pain yesterday radiating to the arms, back, neck, and jaw. Nausea. Shortness of breath.  EXAM: CHEST  2 VIEW  COMPARISON:  11/11/2006  FINDINGS: The heart size and mediastinal contours are within normal limits. Both lungs are clear. The visualized skeletal structures are unremarkable.  IMPRESSION: No active cardiopulmonary disease.   ___________________ Procedures    ____________________________________________   INITIAL IMPRESSION / ASSESSMENT AND PLAN / ED COURSE  Pertinent labs & imaging results that were available during my care of the patient were reviewed by me and considered in my medical decision making (see chart for details).  No clear etiology for the patient's chest pain  ____________________________________________   FINAL CLINICAL IMPRESSION(S) / ED DIAGNOSES  Final diagnoses:  None  Chest pain   Darci Current, MD 04/19/16 [phone]

## 2016-06-19 ENCOUNTER — Other Ambulatory Visit: Payer: Self-pay | Admitting: Physician Assistant

## 2016-06-19 DIAGNOSIS — Z369 Encounter for antenatal screening, unspecified: Secondary | ICD-10-CM

## 2016-06-20 LAB — OB RESULTS CONSOLE HEPATITIS B SURFACE ANTIGEN: Hepatitis B Surface Ag: NEGATIVE

## 2016-06-20 LAB — OB RESULTS CONSOLE HIV ANTIBODY (ROUTINE TESTING): HIV: NONREACTIVE

## 2016-06-20 LAB — OB RESULTS CONSOLE RPR: RPR: NONREACTIVE

## 2016-06-23 ENCOUNTER — Emergency Department: Payer: Medicaid Other

## 2016-06-23 ENCOUNTER — Emergency Department
Admission: EM | Admit: 2016-06-23 | Discharge: 2016-06-23 | Disposition: A | Discharge status: Home / Self Care | Payer: Medicaid Other | Attending: Emergency Medicine | Admitting: Emergency Medicine

## 2016-06-23 ENCOUNTER — Encounter: Payer: Self-pay | Admitting: *Deleted

## 2016-06-23 DIAGNOSIS — Z791 Long term (current) use of non-steroidal anti-inflammatories (NSAID): Secondary | ICD-10-CM | POA: Diagnosis not present

## 2016-06-23 DIAGNOSIS — O209 Hemorrhage in early pregnancy, unspecified: Secondary | ICD-10-CM | POA: Diagnosis not present

## 2016-06-23 DIAGNOSIS — I10 Essential (primary) hypertension: Secondary | ICD-10-CM | POA: Diagnosis not present

## 2016-06-23 DIAGNOSIS — N9489 Other specified conditions associated with female genital organs and menstrual cycle: Secondary | ICD-10-CM | POA: Diagnosis not present

## 2016-06-23 DIAGNOSIS — F17210 Nicotine dependence, cigarettes, uncomplicated: Secondary | ICD-10-CM | POA: Diagnosis not present

## 2016-06-23 DIAGNOSIS — Z3A11 11 weeks gestation of pregnancy: Secondary | ICD-10-CM | POA: Diagnosis not present

## 2016-06-23 DIAGNOSIS — O99331 Smoking (tobacco) complicating pregnancy, first trimester: Secondary | ICD-10-CM | POA: Insufficient documentation

## 2016-06-23 DIAGNOSIS — O4690 Antepartum hemorrhage, unspecified, unspecified trimester: Secondary | ICD-10-CM

## 2016-06-23 LAB — BASIC METABOLIC PANEL WITH GFR
Anion gap: 7 (ref 5–15)
BUN: 7 mg/dL (ref 6–20)
CO2: 23 mmol/L (ref 22–32)
Calcium: 9.2 mg/dL (ref 8.9–10.3)
Chloride: 105 mmol/L (ref 101–111)
Creatinine, Ser: 0.74 mg/dL (ref 0.44–1.00)
GFR calc Af Amer: >60 mL/min
GFR calc non Af Amer: >60 mL/min
Glucose, Bld: 106 mg/dL — ABNORMAL HIGH (ref 65–99)
Potassium: 3.6 mmol/L (ref 3.5–5.1)
Sodium: 135 mmol/L (ref 135–145)

## 2016-06-23 LAB — URINALYSIS COMPLETE WITH MICROSCOPIC (ARMC ONLY)
Bilirubin Urine: NEGATIVE
GLUCOSE, UA: NEGATIVE mg/dL
Leukocytes, UA: NEGATIVE
Nitrite: NEGATIVE
PROTEIN: 30 mg/dL — AB
Specific Gravity, Urine: 1.026 (ref 1.005–1.030)
pH: 6 (ref 5.0–8.0)

## 2016-06-23 LAB — HCG, QUANTITATIVE, PREGNANCY: hCG, Beta Chain, Quant, S: 180501 m[IU]/mL — ABNORMAL HIGH

## 2016-06-23 LAB — CBC WITH DIFFERENTIAL/PLATELET
Basophils Absolute: 0 K/uL (ref 0–0.1)
Basophils Relative: 0 %
Eosinophils Absolute: 0.1 K/uL (ref 0–0.7)
Eosinophils Relative: 1 %
HCT: 32.6 % — ABNORMAL LOW (ref 35.0–47.0)
Hemoglobin: 11.1 g/dL — ABNORMAL LOW (ref 12.0–16.0)
Lymphocytes Relative: 29 %
Lymphs Abs: 1.8 K/uL (ref 1.0–3.6)
MCH: 28.2 pg (ref 26.0–34.0)
MCHC: 34.2 g/dL (ref 32.0–36.0)
MCV: 82.6 fL (ref 80.0–100.0)
Monocytes Absolute: 0.5 K/uL (ref 0.2–0.9)
Monocytes Relative: 8 %
Neutro Abs: 3.7 K/uL (ref 1.4–6.5)
Neutrophils Relative %: 62 %
Platelets: 172 K/uL (ref 150–440)
RBC: 3.95 MIL/uL (ref 3.80–5.20)
RDW: 13.9 % (ref 11.5–14.5)
WBC: 6.1 K/uL (ref 3.6–11.0)

## 2016-06-23 LAB — POCT PREGNANCY, URINE: PREG TEST UR: POSITIVE — AB

## 2016-06-23 LAB — ABO/RH: ABO/RH(D): O POS

## 2016-06-23 NOTE — ED Triage Notes (Signed)
Pt reports she is [redacted] weeks pregnant and has vag bleeding that began today.   Pt also reports abd cramping.  No urinary sx

## 2016-06-23 NOTE — ED Notes (Signed)
Lab called to add on ABO/Rh and hCG.

## 2016-06-23 NOTE — ED Notes (Signed)
Bright red vaginal bleeding and lower abdominal cramping today. Pt approx [redacted] weeks pregnant. Pt tearful.

## 2016-06-23 NOTE — ED Notes (Signed)
Discharge instructions reviewed with patient. Patient verbalized understanding. Patient ambulated to lobby without difficulty.   

## 2016-06-23 NOTE — ED Provider Notes (Signed)
Time Seen: Approximately1915  I have reviewed the triage notes  Chief Complaint: Vaginal Bleeding   History of Present Illness: Cheryl Hines is a 30 y.o. female who states that she is approximately 9 weeks by her last menstrual period. Patient states she hasn't had an OB/GYN evaluation up to this point. Patient presents today because she had some spotty vaginal bleeding after ambulating at home. She reports some mild abdominal cramping which is now resolved. She denies any fever or heavy vaginal bleeding. She denies any vaginal discharge   Past Medical History:  Diagnosis Date  . Anemia   . Hypertension   . Stroke (HCC)   . Vertigo     There are no active problems to display for this patient.   No past surgical history on file.  No past surgical history on file.  Current Outpatient Rx  . Order #: [id number] Class: Historical Med  . Order #: [id number] Class: Print  . Order #: [id number] Class: Print  . Order #: [id number] Class: Print    Allergies:  Review of patient's allergies indicates no known allergies.  Family History: No family history on file.  Social History: Social History  Substance Use Topics  . Smoking status: Current Every Day Smoker    Packs/day: 0.50    Types: Cigarettes  . Smokeless tobacco: Never Used  . Alcohol use No     Review of Systems:   10 point review of systems was performed and was otherwise negative:  Constitutional: No fever Eyes: No visual disturbances ENT: No sore throat, ear pain Cardiac: No chest pain Respiratory: No shortness of breath, wheezing, or stridor Abdomen: No Persistent abdominal pain, no vomiting, No diarrhea Endocrine: No weight loss, No night sweats Extremities: No peripheral edema, cyanosis Skin: No rashes, easy bruising Neurologic: No focal weakness, trouble with speech or swollowing Urologic: No dysuria, Hematuria, or urinary frequency   Physical Exam:  ED Triage Vitals  Enc Vitals Group      BP 06/23/16 1801 124/74     Pulse Rate 06/23/16 1759 87     Resp 06/23/16 1759 18     Temp 06/23/16 1759 98.4 F (36.9 C)     Temp Source 06/23/16 1759 Oral     SpO2 06/23/16 1759 100 %     Weight 06/23/16 1759 207 lb (93.9 kg)     Height 06/23/16 1759 5\' 6"  (1.676 m)     Head Circumference --      Peak Flow --      Pain Score 06/23/16 1800 7     Pain Loc --      Pain Edu? --      Excl. in GC? --     General: Awake , Alert , and Oriented times 3; GCS 15 Head: Normal cephalic , atraumatic Eyes: Pupils equal , round, reactive to light Nose/Throat: No nasal drainage, patent upper airway without erythema or exudate.  Neck: Supple, Full range of motion, No anterior adenopathy or palpable thyroid masses Lungs: Clear to ascultation without wheezes , rhonchi, or rales Heart: Regular rate, regular rhythm without murmurs , gallops , or rubs Abdomen: Soft, non tender without rebound, guarding , or rigidity; bowel sounds positive and symmetric in all 4 quadrants. No organomegaly .        Extremities: 2 plus symmetric pulses. No edema, clubbing or cyanosis Neurologic: normal ambulation, Motor symmetric without deficits, sensory intact Skin: warm, dry, no rashes Pelvic exam deferred for ultrasound evaluation  Labs:   All laboratory work  was reviewed including any pertinent negatives or positives listed below:  Labs Reviewed  CBC WITH DIFFERENTIAL/PLATELET - Abnormal; Notable for the following:       Result Value   Hemoglobin 11.1 (*)    HCT 32.6 (*)    All other components within normal limits  BASIC METABOLIC PANEL - Abnormal; Notable for the following:    Glucose, Bld 106 (*)    All other components within normal limits  URINALYSIS COMPLETEWITH MICROSCOPIC ([facility] ONLY) - Abnormal; Notable for the following:    Color, Urine YELLOW (*)    APPearance CLEAR (*)    Ketones, ur TRACE (*)    Hgb urine dipstick 2+ (*)    Protein, ur 30 (*)    Bacteria, UA RARE (*)    Squamous  Epithelial / LPF 6-30 (*)    All other components within normal limits  HCG, QUANTITATIVE, PREGNANCY - Abnormal; Notable for the following:    hCG, Beta Chain, Quant, S 180,501 (*)    All other components within normal limits  POCT PREGNANCY, URINE - Abnormal; Notable for the following:    Preg Test, Ur POSITIVE (*)    All other components within normal limits  POC URINE PREG, ED  ABO/RH  Laboratory work was reviewed and showed no clinically significant abnormalities.  Blood type is O+  Radiology:  "US Ob Comp Less 14 Wks  Result Date: 06/23/2016 CLINICAL DATA:  30 year old pregnant female presenting with vaginal bleeding. EXAM: OBSTETRIC <14 WK ULTRASOUND TECHNIQUE: Transabdominal ultrasound was performed for evaluation of the gestation as well as the maternal uterus and adnexal regions. COMPARISON:  None. FINDINGS: Intrauterine gestational sac: Present Yolk sac:  Present Embryo:  Present Cardiac Activity: Detected Heart Rate: 171 bpm CRL:   42  mm   11 w 1 d                  [place] EDC: 01/11/2017 Subchorionic hemorrhage:  None visualized. Maternal uterus/adnexae: The right ovary appears unremarkable and measures 3.4 x 2.1 x 2.4 cm. The left ovary is not visualized and obscured by bowel gas. No significant free fluid within the pelvis. IMPRESSION: Single live intrauterine pregnancy with an estimated gestational age of [redacted] weeks, 1 day. Electronically Signed   By: Elgie Collard M.D.   On: 06/23/2016 20:02  "  I personally reviewed the radiologic studies    ED Course:  Patient's stay here was uneventful and her differential includes spontaneous miscarriage or threatened spontaneous abortion, ectopic pregnancy, normal interuterine bleeding in first trimester pregnancy  Given the patient's current clinical presentation and objective findings appears to be normal bleeding from her pregnancy. Patient was advised to return here if she has heavy vaginal bleeding, fever, persistent vomiting, focal  abdominal pain, or any other new concerns.* Clinical Course     Assessment:  First trimester vaginal bleeding Final Clinical Impression:   Final diagnoses:  Vaginal bleeding in pregnancy     Plan:  Patient was advised to return immediately if condition worsens. Patient was advised to follow up with their primary care physician or other specialized physicians involved in their outpatient care. The patient and/or family member/power of attorney had laboratory results reviewed at the bedside. All questions and concerns were addressed and appropriate discharge instructions were distributed by the nursing staff.             Jennye Moccasin, MD 06/23/16 2142

## 2016-06-23 NOTE — Discharge Instructions (Signed)
Please return immediately if condition worsens. Please contact her primary physician or the physician you were given for referral. If you have any specialist physicians involved in her treatment and plan please also contact them. Thank you for using [facility] emergency Department. ° °

## 2016-07-02 ENCOUNTER — Emergency Department
Admission: EM | Admit: 2016-07-02 | Discharge: 2016-07-02 | Disposition: A | Discharge status: Home / Self Care | Payer: Medicaid Other | Attending: Student in an Organized Health Care Education/Training Program | Admitting: Student in an Organized Health Care Education/Training Program

## 2016-07-02 ENCOUNTER — Encounter: Payer: Self-pay | Admitting: Emergency Medicine

## 2016-07-02 ENCOUNTER — Emergency Department: Payer: Medicaid Other

## 2016-07-02 DIAGNOSIS — R102 Pelvic and perineal pain: Secondary | ICD-10-CM | POA: Diagnosis not present

## 2016-07-02 DIAGNOSIS — Z3A12 12 weeks gestation of pregnancy: Secondary | ICD-10-CM | POA: Diagnosis not present

## 2016-07-02 DIAGNOSIS — O4690 Antepartum hemorrhage, unspecified, unspecified trimester: Secondary | ICD-10-CM

## 2016-07-02 DIAGNOSIS — O209 Hemorrhage in early pregnancy, unspecified: Secondary | ICD-10-CM | POA: Insufficient documentation

## 2016-07-02 DIAGNOSIS — I10 Essential (primary) hypertension: Secondary | ICD-10-CM | POA: Diagnosis not present

## 2016-07-02 DIAGNOSIS — Z791 Long term (current) use of non-steroidal anti-inflammatories (NSAID): Secondary | ICD-10-CM | POA: Insufficient documentation

## 2016-07-02 DIAGNOSIS — Z87891 Personal history of nicotine dependence: Secondary | ICD-10-CM | POA: Diagnosis not present

## 2016-07-02 LAB — URINALYSIS COMPLETE WITH MICROSCOPIC (ARMC ONLY)
Bacteria, UA: NONE SEEN
Bilirubin Urine: NEGATIVE
GLUCOSE, UA: NEGATIVE mg/dL
Leukocytes, UA: NEGATIVE
NITRITE: NEGATIVE
Protein, ur: NEGATIVE mg/dL
SPECIFIC GRAVITY, URINE: 1.008 (ref 1.005–1.030)
pH: 6 (ref 5.0–8.0)

## 2016-07-02 LAB — BASIC METABOLIC PANEL
ANION GAP: 7 (ref 5–15)
BUN: 6 mg/dL (ref 6–20)
CALCIUM: 8.7 mg/dL — AB (ref 8.9–10.3)
CO2: 21 mmol/L — ABNORMAL LOW (ref 22–32)
Chloride: 107 mmol/L (ref 101–111)
Creatinine, Ser: 0.57 mg/dL (ref 0.44–1.00)
Glucose, Bld: 92 mg/dL (ref 65–99)
Potassium: 3.3 mmol/L — ABNORMAL LOW (ref 3.5–5.1)
Sodium: 135 mmol/L (ref 135–145)

## 2016-07-02 LAB — CBC WITH DIFFERENTIAL/PLATELET
BASOS ABS: 0 10*3/uL (ref 0–0.1)
BASOS PCT: 0 %
EOS PCT: 1 %
Eosinophils Absolute: 0.1 10*3/uL (ref 0–0.7)
HCT: 32.7 % — ABNORMAL LOW (ref 35.0–47.0)
Hemoglobin: 11.3 g/dL — ABNORMAL LOW (ref 12.0–16.0)
Lymphocytes Relative: 36 %
Lymphs Abs: 2.1 10*3/uL (ref 1.0–3.6)
MCH: 28.4 pg (ref 26.0–34.0)
MCHC: 34.6 g/dL (ref 32.0–36.0)
MCV: 82.3 fL (ref 80.0–100.0)
MONO ABS: 0.5 10*3/uL (ref 0.2–0.9)
Monocytes Relative: 9 %
Neutro Abs: 3.1 10*3/uL (ref 1.4–6.5)
Neutrophils Relative %: 54 %
PLATELETS: 164 10*3/uL (ref 150–440)
RBC: 3.97 MIL/uL (ref 3.80–5.20)
RDW: 14 % (ref 11.5–14.5)
WBC: 5.9 10*3/uL (ref 3.6–11.0)

## 2016-07-02 LAB — HCG, QUANTITATIVE, PREGNANCY: HCG, BETA CHAIN, QUANT, S: 175661 m[IU]/mL — AB (ref <5)

## 2016-07-02 MED ORDER — NITROFURANTOIN MONOHYD MACRO 100 MG PO CAPS: 100.0000 mg | ORAL_CAPSULE | Freq: Two times a day (BID) | ORAL | 0 refills | Status: AC

## 2016-07-02 NOTE — ED Notes (Signed)
MD in room for pelvic exam at this time.

## 2016-07-02 NOTE — ED Provider Notes (Signed)
[facility] Emergency Department Provider Note    First MD Initiated Contact with Patient 07/02/16 0600     (approximate)  I have reviewed the triage vital signs and the nursing notes.   HISTORY  Chief Complaint Abdominal Pain and Vaginal Bleeding    HPI Bernardette Brita Hines is a 30 y.o. female who is roughly [redacted] weeks pregnant has been having off and on spotting for roughly 1 week. Patient was awoken from sleep around 5:30 this morning with crampy abdominal pain and sensation of feeling that she was urinating on herself. Went to check and she was having some vaginal bleeding. Denies any lightheadedness, shortness of breath or dizziness. Denies any history of bleeding disorders. This is her third pregnancy. Denies any recent trauma. She does not smoke. No alcohol abuse. No other polysubstance use. She's had fetal heart tones measure but no formal ultrasound.   Past Medical History:  Diagnosis Date  . Anemia   . Hypertension   . Stroke (HCC)   . Vertigo     There are no active problems to display for this patient.   History reviewed. No pertinent surgical history.  Prior to Admission medications   Medication Sig Start Date End Date Taking? Authorizing Provider  ibuprofen (ADVIL,MOTRIN) 200 MG tablet Take 400 mg by mouth every 6 (six) hours as needed for headache (migraines.).   Yes Historical Provider, MD  meclizine (ANTIVERT) 50 MG tablet Take 0.5 tablets (25 mg total) by mouth 3 (three) times daily as needed. Patient taking differently: Take 25 mg by mouth 3 (three) times daily as needed for dizziness or nausea.  10/26/15  Yes Jeanmarie Plant, MD  meclizine (ANTIVERT) 12.5 MG tablet Take 1 tablet (12.5 mg total) by mouth every 8 (eight) hours as needed for dizziness or nausea. Patient not taking: Reported on 07/02/2016 11/24/15   Gayla Doss, MD  nitrofurantoin, macrocrystal-monohydrate, (MACROBID) 100 MG capsule Take 1 capsule (100 mg total)  by mouth 2 (two) times daily. 07/02/16 07/09/16  Willy Eddy, MD  ondansetron (ZOFRAN) 4 MG tablet Take 1 tablet (4 mg total) by mouth daily as needed for nausea or vomiting. Patient not taking: Reported on 07/02/2016 10/26/15   Jeanmarie Plant, MD    Allergies Review of patient's allergies indicates no known allergies.  History reviewed. No pertinent family history.  Social History Social History  Substance Use Topics  . Smoking status: Former Smoker    Packs/day: 0.50    Types: Cigarettes    Quit date: 04/13/2016  . Smokeless tobacco: Never Used  . Alcohol use No    Review of Systems Patient denies headaches, rhinorrhea, blurry vision, numbness, shortness of breath, chest pain, edema, cough, abdominal pain, nausea, vomiting, diarrhea, dysuria, fevers, rashes or hallucinations unless otherwise stated above in HPI. ____________________________________________   PHYSICAL EXAM:  VITAL SIGNS: Vitals:   07/02/16 0601  BP: 129/85  Pulse: 84  Resp: 20  Temp: 98.3 F (36.8 C)    Constitutional: Alert and oriented. Well appearing and in no acute distress. Eyes: Conjunctivae are normal. PERRL. EOMI. Head: Atraumatic. Nose: No congestion/rhinnorhea. Mouth/Throat: Mucous membranes are moist.  Oropharynx non-erythematous. Neck: No stridor. Painless ROM. No cervical spine tenderness to palpation Hematological/Lymphatic/Immunilogical: No cervical lymphadenopathy. Cardiovascular: Normal rate, regular rhythm. Grossly normal heart sounds.  Good peripheral circulation. Respiratory: Normal respiratory effort.  No retractions. Lungs CTAB. Gastrointestinal: Soft and nontender. No distention. No abdominal bruits. No CVA tenderness. Genitourinary: No CMT, normal healthy appearing cervix with  closed Os.  No vaginal lacerations. Musculoskeletal: No lower extremity tenderness nor edema.  No joint effusions. Neurologic:  Normal speech and language. No gross focal neurologic deficits are  appreciated. No gait instability. Skin:  Skin is warm, dry and intact. No rash noted. Psychiatric: Mood and affect are normal. Speech and behavior are normal.  ____________________________________________   LABS (all labs ordered are listed, but only abnormal results are displayed)  Results for orders placed or performed during the hospital encounter of 07/02/16 (from the past 24 hour(s))  hCG, quantitative, pregnancy     Status: Abnormal   Collection Time: 07/02/16  6:01 AM  Result Value Ref Range   hCG, Beta Chain, Quant, S 175,661 (H) <5 mIU/mL  CBC with Differential     Status: Abnormal   Collection Time: 07/02/16  6:02 AM  Result Value Ref Range   WBC 5.9 3.6 - 11.0 K/uL   RBC 3.97 3.80 - 5.20 MIL/uL   Hemoglobin 11.3 (L) 12.0 - 16.0 g/dL   HCT 16.1 (L) 09.6 - 04.5 %   MCV 82.3 80.0 - 100.0 fL   MCH 28.4 26.0 - 34.0 pg   MCHC 34.6 32.0 - 36.0 g/dL   RDW 40.9 81.1 - 91.4 %   Platelets 164 150 - 440 K/uL   Neutrophils Relative % 54 %   Neutro Abs 3.1 1.4 - 6.5 K/uL   Lymphocytes Relative 36 %   Lymphs Abs 2.1 1.0 - 3.6 K/uL   Monocytes Relative 9 %   Monocytes Absolute 0.5 0.2 - 0.9 K/uL   Eosinophils Relative 1 %   Eosinophils Absolute 0.1 0 - 0.7 K/uL   Basophils Relative 0 %   Basophils Absolute 0.0 0 - 0.1 K/uL  Basic metabolic panel     Status: Abnormal   Collection Time: 07/02/16  6:02 AM  Result Value Ref Range   Sodium 135 135 - 145 mmol/L   Potassium 3.3 (L) 3.5 - 5.1 mmol/L   Chloride 107 101 - 111 mmol/L   CO2 21 (L) 22 - 32 mmol/L   Glucose, Bld 92 65 - 99 mg/dL   BUN 6 6 - 20 mg/dL   Creatinine, Ser 7.82 0.44 - 1.00 mg/dL   Calcium 8.7 (L) 8.9 - 10.3 mg/dL   GFR calc non Af Amer >60 >60 mL/min   GFR calc Af Amer >60 >60 mL/min   Anion gap 7 5 - 15   ____________________________________________ ____________________________________  RADIOLOGY  I personally reviewed all radiographic images ordered to evaluate for the above acute complaints and  reviewed radiology reports and findings.  These findings were personally discussed with the patient.  Please see medical record for radiology report.  ____________________________________________   PROCEDURES  Procedure(s) performed: none    Critical Care performed: no ____________________________________________   INITIAL IMPRESSION / ASSESSMENT AND PLAN / ED COURSE  Pertinent labs & imaging results that were available during my care of the patient were reviewed by me and considered in my medical decision making (see chart for details).  DDX: threatened miscarriage, incomplete miscarriage, placenta previa, ectopic  Bernardette Luster Landsberg Whittinghill is a 30 y.o. who presents to the ED with vaginal bleeding and cramping. Patient is AFVSS in ED. Exam as above. Given current presentation have considered the above differential.  Abdominal exam is benign.  HDS.  Will order [place] to evaluate fetus.  The patient will be placed on continuous pulse oximetry and telemetry for monitoring.  Laboratory evaluation will be sent to evaluate for the  above complaints.     Clinical Course  Comment By Time  Pelvic exam performed with nurse Nicki Guadalajararicia chaperone. The patient were closed external cervical os. No retained products of conception in the vaginal vault. There is evidence of blood in the vaginal vault but no active bleeding. Cervix is healthy-appearing and nonfriable.  She is complaining of dysuria we will send UA as well as start patient on Macrobid. I discussed follow-up with GYN. Discussed signs and symptoms for which the patient should return immediately to the hospital.  Have discussed with the patient and available family all diagnostics and treatments performed thus far and all questions were answered to the best of my ability. The patient demonstrates understanding and agreement with plan.  Willy EddyPatrick Vallerie Hentz, MD 10/11 0815     ____________________________________________   FINAL CLINICAL  IMPRESSION(S) / ED DIAGNOSES  Final diagnoses:  Vaginal bleeding in pregnancy      NEW MEDICATIONS STARTED DURING THIS VISIT:  New Prescriptions   NITROFURANTOIN, MACROCRYSTAL-MONOHYDRATE, (MACROBID) 100 MG CAPSULE    Take 1 capsule (100 mg total) by mouth 2 (two) times daily.     Note:  This document was prepared using Dragon voice recognition software and may include unintentional dictation errors.    Willy EddyPatrick Melik Blancett, MD 07/02/16 [phone]

## 2016-07-02 NOTE — ED Notes (Signed)
Patient went to ultrasound.

## 2016-07-02 NOTE — ED Notes (Signed)
Pt returned from ultrasound

## 2016-07-02 NOTE — ED Triage Notes (Signed)
Pt states she is about [redacted] weeks pregnant and has been spotting for about a week and states tonight woke up out of her sleep with severe cramping and bleeding. Arrived EMS from home. Pregnant 9 years ago no complications.

## 2016-07-17 ENCOUNTER — Ambulatory Visit
Admission: RE | Admit: 2016-07-17 | Discharge: 2016-07-17 | Disposition: A | Discharge status: Home / Self Care | Payer: Medicaid Other | Source: Ambulatory Visit | Attending: Physician Assistant | Admitting: Physician Assistant

## 2016-07-17 ENCOUNTER — Other Ambulatory Visit: Payer: Self-pay | Admitting: Physician Assistant

## 2016-07-17 ENCOUNTER — Emergency Department
Admission: EM | Admit: 2016-07-17 | Discharge: 2016-07-17 | Disposition: A | Discharge status: Home / Self Care | Payer: Medicaid Other | Attending: Emergency Medicine | Admitting: Emergency Medicine

## 2016-07-17 ENCOUNTER — Ambulatory Visit (HOSPITAL_BASED_OUTPATIENT_CLINIC_OR_DEPARTMENT_OTHER)
Admission: RE | Admit: 2016-07-17 | Discharge: 2016-07-17 | Disposition: A | Discharge status: Home / Self Care | Payer: Medicaid Other | Source: Ambulatory Visit | Attending: Obstetrics & Gynecology | Admitting: Obstetrics & Gynecology

## 2016-07-17 DIAGNOSIS — Z832 Family history of diseases of the blood and blood-forming organs and certain disorders involving the immune mechanism: Secondary | ICD-10-CM

## 2016-07-17 DIAGNOSIS — Z3A14 14 weeks gestation of pregnancy: Secondary | ICD-10-CM | POA: Diagnosis not present

## 2016-07-17 DIAGNOSIS — Z369 Encounter for antenatal screening, unspecified: Secondary | ICD-10-CM | POA: Diagnosis not present

## 2016-07-17 DIAGNOSIS — O219 Vomiting of pregnancy, unspecified: Secondary | ICD-10-CM | POA: Insufficient documentation

## 2016-07-17 DIAGNOSIS — R112 Nausea with vomiting, unspecified: Secondary | ICD-10-CM | POA: Diagnosis present

## 2016-07-17 DIAGNOSIS — I10 Essential (primary) hypertension: Secondary | ICD-10-CM | POA: Diagnosis not present

## 2016-07-17 DIAGNOSIS — Z87891 Personal history of nicotine dependence: Secondary | ICD-10-CM | POA: Diagnosis not present

## 2016-07-17 DIAGNOSIS — O0991 Supervision of high risk pregnancy, unspecified, first trimester: Secondary | ICD-10-CM | POA: Insufficient documentation

## 2016-07-17 DIAGNOSIS — O0990 Supervision of high risk pregnancy, unspecified, unspecified trimester: Secondary | ICD-10-CM

## 2016-07-17 LAB — COMPREHENSIVE METABOLIC PANEL
ALT: 26 U/L (ref 14–54)
AST: 20 U/L (ref 15–41)
Albumin: 3.6 g/dL (ref 3.5–5.0)
Alkaline Phosphatase: 49 U/L (ref 38–126)
Anion gap: 8 (ref 5–15)
BUN: 6 mg/dL (ref 6–20)
CHLORIDE: 105 mmol/L (ref 101–111)
CO2: 23 mmol/L (ref 22–32)
CREATININE: 0.55 mg/dL (ref 0.44–1.00)
Calcium: 9.2 mg/dL (ref 8.9–10.3)
GFR calc Af Amer: >60 mL/min (ref >60)
GFR calc non Af Amer: >60 mL/min (ref >60)
Glucose, Bld: 94 mg/dL (ref 65–99)
Potassium: 3.8 mmol/L (ref 3.5–5.1)
SODIUM: 136 mmol/L (ref 135–145)
Total Bilirubin: 0.5 mg/dL (ref 0.3–1.2)
Total Protein: 7.5 g/dL (ref 6.5–8.1)

## 2016-07-17 LAB — CBC
HCT: 36.4 % (ref 35.0–47.0)
Hemoglobin: 12.4 g/dL (ref 12.0–16.0)
MCH: 28.1 pg (ref 26.0–34.0)
MCHC: 34.1 g/dL (ref 32.0–36.0)
MCV: 82.6 fL (ref 80.0–100.0)
PLATELETS: 187 10*3/uL (ref 150–440)
RBC: 4.41 MIL/uL (ref 3.80–5.20)
RDW: 13.6 % (ref 11.5–14.5)
WBC: 7.7 10*3/uL (ref 3.6–11.0)

## 2016-07-17 LAB — LIPASE, BLOOD: LIPASE: 20 U/L (ref 11–51)

## 2016-07-17 MED ORDER — SODIUM CHLORIDE 0.9 % IV BOLUS (SEPSIS)
1000.0000 mL | INTRAVENOUS | Status: AC
  Administered 2016-07-17: 1000 mL via INTRAVENOUS

## 2016-07-17 NOTE — ED Provider Notes (Signed)
[facility] Emergency Department Provider Note  ____________________________________________   First MD Initiated Contact with Patient 07/17/16 Paulo Fruit     (approximate)  I have reviewed the triage vital signs and the nursing notes.   HISTORY  Chief Complaint Emesis During Pregnancy    HPI Cheryl Hines is a 30 y.o. female G3P2 who presents for evaluation of persistent nausea and vomiting during early pregnancy.  She is presently [redacted] weeks pregnant and was seen about 2 weeks ago in this emergency Department for intermittent vaginal bleeding.  She continues to have spotting at times but she had a normal intrauterine pregnancy or 5 by ultrasound 2 weeks ago.  She reports thatrecently she continues to have 2-3 episodes of vomiting per day and which she describes as constant nausea.  She goes to the [facility] Department for prenatal care and they ordered ultrasounds which were performed today but also encouraged her to come to the emergency department for evaluation of possible hyperemesis and dehydration.  Of note her vital signs are stable with no tachycardia and she is not having any lightheadedness or dizziness.  She does report severe fatigue at baseline with this pregnancy.  He denies fever/chills, chest pain, shortness of breath, diarrhea.  She has occasional abdominal cramping and the persistent minimal spotting.  She reports that the health department wrote her a prescription for medication for her vomiting but she has not yet had it filled.   Past Medical History:  Diagnosis Date  . Anemia   . Hypertension   . Stroke (HCC)   . Vertigo     Patient Active Problem List   Diagnosis Date Noted  . First trimester screening 07/17/2016  . Family history of sickle cell trait 07/17/2016    History reviewed. No pertinent surgical history.  Prior to Admission medications   Medication Sig Start Date End Date Taking? Authorizing  Provider  ibuprofen (ADVIL,MOTRIN) 200 MG tablet Take 400 mg by mouth every 6 (six) hours as needed for headache (migraines.).    Historical Provider, MD  meclizine (ANTIVERT) 12.5 MG tablet Take 1 tablet (12.5 mg total) by mouth every 8 (eight) hours as needed for dizziness or nausea. 11/24/15   Gayla Doss, MD  meclizine (ANTIVERT) 50 MG tablet Take 0.5 tablets (25 mg total) by mouth 3 (three) times daily as needed. Patient taking differently: Take 25 mg by mouth 3 (three) times daily as needed for dizziness or nausea.  10/26/15   Jeanmarie Plant, MD  ondansetron (ZOFRAN) 4 MG tablet Take 1 tablet (4 mg total) by mouth daily as needed for nausea or vomiting. 10/26/15   Jeanmarie Plant, MD    Allergies Review of patient's allergies indicates no known allergies.  No family history on file.  Social History Social History  Substance Use Topics  . Smoking status: Former Smoker    Packs/day: 0.50    Types: Cigarettes    Quit date: 04/13/2016  . Smokeless tobacco: Never Used  . Alcohol use No    Review of Systems Constitutional: No fever/chills.  Generalized fatigue Eyes: No visual changes. ENT: No sore throat. Cardiovascular: Denies chest pain. Respiratory: Denies shortness of breath. Gastrointestinal: Persistent nausea and vomiting, occasional lower abdominal cramping Genitourinary: Negative for dysuria.  Intermittent vaginal spotting Musculoskeletal: Negative for back pain. Skin: Negative for rash. Neurological: Negative for headaches, focal weakness or numbness.  10-point ROS otherwise negative.  ____________________________________________   PHYSICAL EXAM:  VITAL SIGNS: ED Triage Vitals  Enc Vitals Group     BP 07/17/16 1705 118/72     Pulse Rate 07/17/16 1705 91     Resp 07/17/16 1705 18     Temp 07/17/16 1705 98.5 F (36.9 C)     Temp Source 07/17/16 1705 Oral     SpO2 07/17/16 1705 99 %     Weight 07/17/16 1707 202 lb (91.6 kg)     Height 07/17/16 1707 5\' 6"  (1.676  m)     Head Circumference --      Peak Flow --      Pain Score 07/17/16 1707 7     Pain Loc --      Pain Edu? --      Excl. in GC? --     Constitutional: Alert and oriented. Well appearing and in no acute distress. Eyes: Conjunctivae are normal. PERRL. EOMI. Head: Atraumatic. Nose: No congestion/rhinnorhea. Mouth/Throat: Mucous membranes are moist.  Oropharynx non-erythematous. Neck: No stridor.  No meningeal signs.   Cardiovascular: Normal rate, regular rhythm. Good peripheral circulation. Grossly normal heart sounds. Respiratory: Normal respiratory effort.  No retractions. Lungs CTAB. Gastrointestinal: Soft and nontender. No distention.  Genitourinary: Deferred Musculoskeletal: No lower extremity tenderness nor edema. No gross deformities of extremities. Neurologic:  Normal speech and language. No gross focal neurologic deficits are appreciated.  Skin:  Skin is warm, dry and intact. No rash noted. Psychiatric: Mood and affect are normal. Speech and behavior are normal.  ____________________________________________   LABS (all labs ordered are listed, but only abnormal results are displayed)  Labs Reviewed  LIPASE, BLOOD  COMPREHENSIVE METABOLIC PANEL  CBC  URINALYSIS COMPLETEWITH MICROSCOPIC ([facility] ONLY)  HCG, QUANTITATIVE, PREGNANCY   ____________________________________________  EKG  None - EKG not ordered by ED physician ____________________________________________  RADIOLOGY   [place] Mfm Ob Limited  Result Date: 07/17/2016 OBSTETRICAL ULTRASOUND: This exam was performed within a [place] [facility] Department. The OB US report was generated in the [facility] system, and faxed to the ordering physician.  This report is available in the [employer] PACS. See the [facility] Obstetric US report via the Image Link.   ____________________________________________   PROCEDURES  Procedure(s) performed:   Procedures   Critical Care performed:  No ____________________________________________   INITIAL IMPRESSION / ASSESSMENT AND PLAN / ED COURSE  Pertinent labs & imaging results that were available during my care of the patient were reviewed by me and considered in my medical decision making (see chart for details).  Patient is well-appearing and in no acute distress.  She did not provide us with a urine but she states that her urine showed some ketones when she went to the health Department earlier today.  Given her persistent vomiting all give her a liter of fluids but she has no lab abnormalities and no vital sign abnormalities.  She is not vomiting or nauseated at this time and I encouraged her to take her outpatient medication and follow-up with the health department.   ____________________________________________  FINAL CLINICAL IMPRESSION(S) / ED DIAGNOSES  Final diagnoses:  Nausea and vomiting during pregnancy prior to [redacted] weeks gestation     MEDICATIONS GIVEN DURING THIS VISIT:  Medications  sodium chloride 0.9 % bolus 1,000 mL (1,000 mLs Intravenous New Bag/Given 07/17/16 1851)     NEW OUTPATIENT MEDICATIONS STARTED DURING THIS VISIT:  New Prescriptions   No medications on file    Modified Medications   No medications on file    Discontinued Medications   No medications on file  Note:  This document was prepared using Dragon voice recognition software and may include unintentional dictation errors.    Loleta Rose, MD 07/17/16 [phone]

## 2016-07-17 NOTE — Progress Notes (Addendum)
Referring provider:  [facility] Length of Consultation: 50 minutes   [name]  was referred to [facility] for genetic counseling to review prenatal screening and testing options.  This note summarizes the information we discussed.    We offered the following routine screening tests for this pregnancy:  First trimester screening, which includes nuchal translucency ultrasound screen and first trimester maternal serum marker screening.  The nuchal translucency has approximately an 80% detection rate for Down syndrome and can be positive for other chromosome abnormalities as well as congenital heart defects.  When combined with a maternal serum marker screening, the detection rate is up to 90% for Down syndrome and up to 97% for trisomy 18.     Maternal serum marker screening, a blood test that measures pregnancy proteins, can provide risk assessments for Down syndrome, trisomy 18, and open neural tube defects (spina bifida, anencephaly). Because it does not directly examine the fetus, it cannot positively diagnose or rule out these problems.  Targeted ultrasound uses high frequency sound waves to create an image of the developing fetus.  An ultrasound is often recommended as a routine means of evaluating the pregnancy.  It is also used to screen for fetal anatomy problems (for example, a heart defect) that might be suggestive of a chromosomal or other abnormality.   Should these screening tests indicate an increased concern, then the following additional testing options would be offered:  The chorionic villus sampling procedure is available for first trimester chromosome analysis.  This involves the withdrawal of a small amount of chorionic villi (tissue from the developing placenta).  Risk of pregnancy loss is estimated to be approximately 1 in 200 to 1 in 100 (0.5 to 1%).  There is approximately a 1% (1 in 100) chance that the CVS chromosome results  will be unclear.  Chorionic villi cannot be tested for neural tube defects.     Amniocentesis involves the removal of a small amount of amniotic fluid from the sac surrounding the fetus with the use of a thin needle inserted through the maternal abdomen and uterus.  Ultrasound guidance is used throughout the procedure.  Fetal cells from amniotic fluid are directly evaluated and > 99.5% of chromosome problems and > 98% of open neural tube defects can be detected. This procedure is generally performed after the 15th week of pregnancy.  The main risks to this procedure include complications leading to miscarriage in less than 1 in 200 cases (0.5%).  As another option for information if the pregnancy is suspected to be an an increased chance for certain chromosome conditions, we also reviewed the availability of cell free fetal DNA testing from maternal blood to determine whether or not the baby may have either Down syndrome, trisomy 8213, or trisomy 7218.  This test utilizes a maternal blood sample and DNA sequencing technology to isolate circulating cell free fetal DNA from maternal plasma.  The fetal DNA can then be analyzed for DNA sequences that are derived from the three most common chromosomes involved in aneuploidy, chromosomes 13, 18, and 21.  If the overall amount of DNA is greater than the expected level for any of these chromosomes, aneuploidy is suspected.  While we do not consider it a replacement for invasive testing and karyotype analysis, a negative result from this testing would be reassuring, though not a guarantee of a normal chromosome complement for the baby.  An abnormal result is certainly suggestive of an abnormal chromosome complement, though  we would still recommend CVS or amniocentesis to confirm any findings from this testing.   Cystic Fibrosis and Spinal Muscular Atrophy (SMA) screening were also discussed with the patient. Both conditions are recessive, which means that both parents  must be carriers in order to have a child with the disease.  Cystic fibrosis (CF) is one of the most common genetic conditions in persons of Caucasian ancestry.  This condition occurs in approximately 1 in 2,500 Caucasian persons and results in thickened secretions in the lungs, digestive, and reproductive systems.  For a baby to be at risk for having CF, both of the parents must be carriers for this condition.  Approximately 1 in 20 Caucasian persons is a carrier for CF.  Current carrier testing looks for the most common mutations in the gene for CF and can detect approximately 90% of carriers in the Caucasian population.  This means that the carrier screening can greatly reduce, but cannot eliminate, the chance for an individual to have a child with CF.  If an individual is found to be a carrier for CF, then carrier testing would be available for the partner. As part of Cheryl Hines's newborn screening profile, all babies born in the state of [place] will have a two-tier screening process.  Specimens are first tested to determine the concentration of immunoreactive trypsinogen (IRT).  The top 5% of specimens with the highest IRT values then undergo DNA testing using a panel of over 40 common CF mutations. SMA is a neurodegenerative disorder that leads to atrophy of skeletal muscle and overall weakness.  This condition is also more prevalent in the Caucasian population, with 1 in 40-1 in 60 persons being a carrier and 1 in 6,000-1 in 10,000 children being affected.  There are multiple forms of the disease, with some causing death in infancy to other forms with survival into adulthood.  The genetics of SMA is complex, but carrier screening can detect up to 95% of carriers in the Caucasian population.  Similar to CF, a negative result can greatly reduce, but cannot eliminate, the chance to have a child with SMA.  We obtained a detailed family history and pregnancy history.  Cheryl Hines stated that this  is her second pregnancy.  She has a healthy son with a previous partner who is in good health.  Her son was born with bilateral polydactyly of the hands and feet.  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  We reviewed that polydactyly may occur along with other birth differences or developmental delays as part of many genetic syndromes, but that in the absence of other health concerns, it is most often an isolated condition.  The chance for recurrence when a parent has polydactyly is estimated to be 50%.  The extra digit may be small and underdeveloped, which is not always able to be seen on ultrasound.  Also in the family, the patient stated that his paternal aunt and her son have sickle cell trait.  We reviewed the inheritance of sickle cell disease and other hemoglobinopathies and the chance for her to be a carrier (1 in 4 based upon family history).  No prior hemoglobinopathy testing was found in her records or from speaking with the [facility], so it was drawn today.  We will contact Ms. Gabor next week with those results and offer testing to the father of the baby if she is found to be a carrier.  The remainder of the family history was reported to be  unremarkable for birth defects, mental retardation, recurrent pregnancy loss or known chromosome abnormalities.  Of note, the patient reported that she had a stroke in 2015.  A review of her medical records at [facility] showed an ER visit with headache and weakness on her left side.  The CT imaging from that visit was reported as normal but the patient left AMA prior to having an MRI performed.  She recalls following up with a neurologist who talked about stroke or migraines but we see no records of that visit. Dr. Leone Brand would recommend the patient to begin a baby aspirin daily for the remainder of the pregnancy due to this possible history.  If a formal MFM consult is desired, we are happy to schedule for her to come back and would need  additional medical information about her follow up at that time.  The patient reported no complications during this pregnancy other than spotting in early October.  She did report smoking 1/2 pack of cigarettes per day prior to pregnancy but stopped as soon as she learned that she was pregnant.  After consideration of the options, Ms. Sotero elected to proceed with first trimester screening and hemoglobinopathy testing.  The couple declined CF and SMA carrier testing.  An ultrasound was performed at the time of the visit.  The gestational age was consistent with  14 weeks.  Fetal anatomy could not be assessed due to early gestational age.  Please refer to the ultrasound report for details of that study.  Ms. Gitlin was encouraged to call with questions or concerns.  We can be contacted at [phone].    Cheryl Anderson, MS, CGC  Yvette Loveless, [place] A, MD

## 2016-07-17 NOTE — Discharge Instructions (Signed)
Your workup was reassuring today with no evidence of blood work abnormalities and no vital sign abnormalities.  We gave you a liter of fluids and we encourage you to take the medication prescribed by the [facility].  Return to the emergency department if you develop new or worsening symptoms that concern you.

## 2016-07-17 NOTE — ED Triage Notes (Signed)
Pt arrives to ER via POV c/o emesis during pregnancy X 2-3 times per day. Sent by [facility] for evaluation. Pt c/o mild abdominal cramping X 2-3 days. Pt alert and oriented X4, active, cooperative, pt in NAD. RR even and unlabored, color WNL.

## 2016-07-18 LAB — HEMOGLOBINOPATHY EVALUATION
HGB C: 0 %
HGB S QUANTITAION: 0 %
Hgb A2 Quant: 2.6 % (ref 0.7–3.1)
Hgb A: 97.4 % (ref 94.0–98.0)
Hgb F Quant: 0 % (ref 0.0–2.0)

## 2016-07-24 ENCOUNTER — Telehealth: Payer: Self-pay | Admitting: Obstetrics and Gynecology

## 2016-07-24 NOTE — Telephone Encounter (Addendum)
[name]  elected to undergo First Trimester screening on 07/17/2016.  To review, first trimester screening, includes nuchal translucency ultrasound screen and/or first trimester maternal serum marker screening.  The nuchal translucency has approximately an 80% detection rate for Down syndrome and can be positive for other chromosome abnormalities as well as heart defects.  When combined with a maternal serum marker screening, the detection rate is up to 90% for Down syndrome and up to 97% for trisomy 13 and 18.     The results of the First Trimester Nuchal Translucency and Biochemical Screening were within normal range.  The risk for Down syndrome is now estimated to be 1 in 5,590.  The risk for Trisomy 13/18 is less than 1 in 10,000  Should more definitive information be desired, we would offer amniocentesis.  Because we do not yet know the effectiveness of combined first and second trimester screening, we do not recommend a maternal serum screen to assess the chance for chromosome conditions.  However, if screening for neural tube defects is desired, maternal serum screening for AFP only can be performed between 15 and [redacted] weeks gestation.    Hemoglobinopathy testing was ordered due to a family history of sickle cell trait.  Testing showed normal AA adult hemoglobin and prior MCV of 82 was documented.  No additional testing is recommended for this history.  We have been unable to reach [name] by phone, as her number "cannot accept calls".  We will fax these results to the [facility] to be given to her at her next OB visit.  Cherly Andersoneborah F. Paradise Vensel, MS, CGC

## 2016-08-11 ENCOUNTER — Other Ambulatory Visit: Payer: Self-pay | Admitting: *Deleted

## 2016-08-11 DIAGNOSIS — Z3492 Encounter for supervision of normal pregnancy, unspecified, second trimester: Secondary | ICD-10-CM

## 2016-08-18 ENCOUNTER — Ambulatory Visit
Admission: RE | Admit: 2016-08-18 | Discharge: 2016-08-18 | Disposition: A | Discharge status: Home / Self Care | Payer: Medicaid Other | Source: Ambulatory Visit | Attending: Obstetrics & Gynecology | Admitting: Obstetrics & Gynecology

## 2016-08-18 DIAGNOSIS — Z3A19 19 weeks gestation of pregnancy: Secondary | ICD-10-CM | POA: Insufficient documentation

## 2016-08-18 DIAGNOSIS — Z0489 Encounter for examination and observation for other specified reasons: Secondary | ICD-10-CM

## 2016-08-18 DIAGNOSIS — Z362 Encounter for other antenatal screening follow-up: Secondary | ICD-10-CM | POA: Diagnosis present

## 2016-08-18 DIAGNOSIS — Z369 Encounter for antenatal screening, unspecified: Secondary | ICD-10-CM

## 2016-08-18 DIAGNOSIS — Z3492 Encounter for supervision of normal pregnancy, unspecified, second trimester: Secondary | ICD-10-CM

## 2016-08-18 DIAGNOSIS — IMO0002 Reserved for concepts with insufficient information to code with codable children: Secondary | ICD-10-CM

## 2016-08-18 DIAGNOSIS — Z832 Family history of diseases of the blood and blood-forming organs and certain disorders involving the immune mechanism: Secondary | ICD-10-CM

## 2016-09-03 ENCOUNTER — Emergency Department
Admission: EM | Admit: 2016-09-03 | Discharge: 2016-09-03 | Disposition: A | Discharge status: Home / Self Care | Payer: Medicaid Other | Attending: Emergency Medicine | Admitting: Emergency Medicine

## 2016-09-03 DIAGNOSIS — O2242 Hemorrhoids in pregnancy, second trimester: Secondary | ICD-10-CM | POA: Insufficient documentation

## 2016-09-03 DIAGNOSIS — Z3A20 20 weeks gestation of pregnancy: Secondary | ICD-10-CM | POA: Diagnosis not present

## 2016-09-03 DIAGNOSIS — Z87891 Personal history of nicotine dependence: Secondary | ICD-10-CM | POA: Insufficient documentation

## 2016-09-03 DIAGNOSIS — K644 Residual hemorrhoidal skin tags: Secondary | ICD-10-CM

## 2016-09-03 DIAGNOSIS — K625 Hemorrhage of anus and rectum: Secondary | ICD-10-CM

## 2016-09-03 DIAGNOSIS — I10 Essential (primary) hypertension: Secondary | ICD-10-CM | POA: Diagnosis not present

## 2016-09-03 LAB — COMPREHENSIVE METABOLIC PANEL
ALBUMIN: 3.5 g/dL (ref 3.5–5.0)
ALT: 16 U/L (ref 14–54)
ANION GAP: 6 (ref 5–15)
AST: 19 U/L (ref 15–41)
Alkaline Phosphatase: 54 U/L (ref 38–126)
BILIRUBIN TOTAL: 0.4 mg/dL (ref 0.3–1.2)
BUN: 6 mg/dL (ref 6–20)
CHLORIDE: 106 mmol/L (ref 101–111)
CO2: 22 mmol/L (ref 22–32)
Calcium: 9.2 mg/dL (ref 8.9–10.3)
Creatinine, Ser: 0.56 mg/dL (ref 0.44–1.00)
GFR calc Af Amer: >60 mL/min (ref >60)
GFR calc non Af Amer: >60 mL/min (ref >60)
GLUCOSE: 88 mg/dL (ref 65–99)
POTASSIUM: 3.8 mmol/L (ref 3.5–5.1)
SODIUM: 134 mmol/L — AB (ref 135–145)
TOTAL PROTEIN: 7.3 g/dL (ref 6.5–8.1)

## 2016-09-03 LAB — CBC
HEMATOCRIT: 34.9 % — AB (ref 35.0–47.0)
Hemoglobin: 11.7 g/dL — ABNORMAL LOW (ref 12.0–16.0)
MCH: 27.8 pg (ref 26.0–34.0)
MCHC: 33.5 g/dL (ref 32.0–36.0)
MCV: 83 fL (ref 80.0–100.0)
Platelets: 173 10*3/uL (ref 150–440)
RBC: 4.2 MIL/uL (ref 3.80–5.20)
RDW: 14.1 % (ref 11.5–14.5)
WBC: 7.4 10*3/uL (ref 3.6–11.0)

## 2016-09-03 MED ORDER — DOCUSATE SODIUM 100 MG PO CAPS: 100.0000 mg | ORAL_CAPSULE | Freq: Two times a day (BID) | ORAL | 0 refills | Status: DC

## 2016-09-03 MED ORDER — TUCKS 50 % EX PADS: 1.0000 "application " | MEDICATED_PAD | CUTANEOUS | 2 refills | Status: DC | PRN

## 2016-09-03 NOTE — ED Notes (Signed)
Katie, RN into witness rectal exam by Dr. Scotty CourtStafford, Pt tolerated well.

## 2016-09-03 NOTE — Discharge Instructions (Signed)
It is okay to use docusate, glycerin suppositories, and witch hazel for your hemorrhoid.    Some hemorrhoid products contain phenylephrine, which you should avoid in pregnancy.

## 2016-09-03 NOTE — ED Provider Notes (Signed)
[facility] Emergency Department Provider Note  ____________________________________________  Time seen: Approximately 7:09 PM  I have reviewed the triage vital signs and the nursing notes.   HISTORY  Chief Complaint Rectal Bleeding    HPI Cheryl Hines is a 30 y.o. female who complains of rectal pain and rectal bleeding for the past week. She started taking Colace 4 days ago but did not give immediate relief and so she stopped 2 days ago. She does note that when she uses her Phenergan suppositories it seems to help. No abdominal pain vaginal bleeding or discharge or leakage of fluid or contractions. No chest pain shortness of breath or fever. Otherwise feels perfectly fine.     Past Medical History:  Diagnosis Date  . Anemia   . Hypertension   . Stroke (HCC)   . Vertigo      Patient Active Problem List   Diagnosis Date Noted  . First trimester screening 07/17/2016  . Family history of sickle cell trait 07/17/2016     History reviewed. No pertinent surgical history.   Prior to Admission medications   Medication Sig Start Date End Date Taking? Authorizing Provider  docusate sodium (COLACE) 100 MG capsule Take 1 capsule (100 mg total) by mouth 2 (two) times daily. 09/03/16   Sharman CheekPhillip Brynli Ollis, MD  ondansetron (ZOFRAN) 4 MG tablet Take 1 tablet (4 mg total) by mouth daily as needed for nausea or vomiting. 10/26/15   Jeanmarie PlantJames A McShane, MD  Witch Hazel (TUCKS) 50 % PADS Apply 1 application topically every 2 (two) hours as needed. 09/03/16   Sharman CheekPhillip Royston Bekele, MD     Allergies Patient has no known allergies.   No family history on file.  Social History Social History  Substance Use Topics  . Smoking status: Former Smoker    Packs/day: 0.50    Types: Cigarettes    Quit date: 04/13/2016  . Smokeless tobacco: Never Used  . Alcohol use No    Review of Systems  Constitutional:   No fever or chills.  ENT:   No sore throat. No  rhinorrhea. Cardiovascular:   No chest pain. Respiratory:   No dyspnea or cough. Gastrointestinal:   Negative for abdominal pain, vomiting and diarrhea.  Genitourinary:   Negative for dysuria or difficulty urinating. Musculoskeletal:   Negative for focal pain or swelling Neurological:   Negative for headaches 10-point ROS otherwise negative.  ____________________________________________   PHYSICAL EXAM:  VITAL SIGNS: ED Triage Vitals  Enc Vitals Group     BP 09/03/16 1642 133/65     Pulse Rate 09/03/16 1642 91     Resp 09/03/16 1642 18     Temp 09/03/16 1642 97.7 F (36.5 C)     Temp Source 09/03/16 1642 Oral     SpO2 09/03/16 1642 100 %     Weight 09/03/16 1641 207 lb (93.9 kg)     Height 09/03/16 1641 5\' 6"  (1.676 m)     Head Circumference --      Peak Flow --      Pain Score 09/03/16 1641 8     Pain Loc --      Pain Edu? --      Excl. in GC? --     Vital signs reviewed, nursing assessments reviewed.   Constitutional:   Alert and oriented. Well appearing and in no distress. Eyes:   No scleral icterus. No conjunctival pallor. PERRL. EOMI.  No nystagmus. ENT   Head:   Normocephalic and atraumatic.  Nose:   No congestion/rhinnorhea. No septal hematoma   Mouth/Throat:   MMM, no pharyngeal erythema. No peritonsillar mass.    Neck:   No stridor. No SubQ emphysema. No meningismus. Hematological/Lymphatic/Immunilogical:   No cervical lymphadenopathy. Cardiovascular:   RRR. Symmetric bilateral radial and DP pulses.  No murmurs.  Respiratory:   Normal respiratory effort without tachypnea nor retractions. Breath sounds are clear and equal bilaterally. No wheezes/rales/rhonchi. Gastrointestinal:   Soft and nontender. Non distended. There is no CVA tenderness.  No rebound, rigidity, or guarding.Rectal exam performed with nurse Katie at bedside. No visible anal fissure. There is a small protruded external hemorrhoid, and on digital exam there is a sizable external  hemorrhoid which is soft. No gross bleeding, brown stool, Hemoccult positive. Genitourinary:   deferred Musculoskeletal:   Nontender with normal range of motion in all extremities. No joint effusions.  No lower extremity tenderness.  No edema. Neurologic:   Normal speech and language.  CN 2-10 normal. Motor grossly intact. No gross focal neurologic deficits are appreciated.  Skin:    Skin is warm, dry and intact. No rash noted.  No petechiae, purpura, or bullae.  ____________________________________________    LABS (pertinent positives/negatives) (all labs ordered are listed, but only abnormal results are displayed) Labs Reviewed  COMPREHENSIVE METABOLIC PANEL - Abnormal; Notable for the following:       Result Value   Sodium 134 (*)    All other components within normal limits  CBC - Abnormal; Notable for the following:    Hemoglobin 11.7 (*)    HCT 34.9 (*)    All other components within normal limits   ____________________________________________   EKG    ____________________________________________    RADIOLOGY    ____________________________________________   PROCEDURES Procedures  ____________________________________________   INITIAL IMPRESSION / ASSESSMENT AND PLAN / ED COURSE  Pertinent labs & imaging results that were available during my care of the patient were reviewed by me and considered in my medical decision making (see chart for details).  Patient well appearing no acute distress, presents with rectal pain, exam consistent with hemorrhoid. We'll treat symptomatically with docusate witch hazel and sitz baths. Follow-up with obstetrics. Advised against phenylephrine products that are marketed for hemorrhoids. No evidence of significant GI bleed. Patient's very calm and comfortable and asymptomatic and feeling fine except for the rectal pain.Considering the patient's symptoms, medical history, and physical examination today, I have low suspicion for  cholecystitis or biliary pathology, pancreatitis, perforation or bowel obstruction, hernia, intra-abdominal abscess, AAA or dissection, volvulus or intussusception, mesenteric ischemia, or appendicitis.       Clinical Course    ____________________________________________   FINAL CLINICAL IMPRESSION(S) / ED DIAGNOSES  Final diagnoses:  External hemorrhoid  Rectal bleeding      New Prescriptions   DOCUSATE SODIUM (COLACE) 100 MG CAPSULE    Take 1 capsule (100 mg total) by mouth 2 (two) times daily.   WITCH HAZEL (TUCKS) 50 % PADS    Apply 1 application topically every 2 (two) hours as needed.     Portions of this note were generated with dragon dictation software. Dictation errors may occur despite best attempts at proofreading.    Sharman CheekPhillip Stellar Gensel, MD 09/03/16 [phone]

## 2016-09-03 NOTE — ED Triage Notes (Signed)
Pt c/o bright red blood with BM X 1 week. Pt started taking stool softer X 4 days ago. Pt reports pain with BM. Pt alert and oriented X4, active, cooperative, pt in NAD. RR even and unlabored, color WNL.  Pt pregnant.

## 2016-09-18 ENCOUNTER — Other Ambulatory Visit: Payer: Self-pay | Admitting: *Deleted

## 2016-09-18 DIAGNOSIS — Z8751 Personal history of pre-term labor: Secondary | ICD-10-CM

## 2016-09-22 NOTE — L&D Delivery Note (Signed)
Obstetrical Delivery Note   Date of Delivery:   01/13/2017 Primary OB:   [facility] Gestational Age/EDD: [redacted]w[redacted]d (Dated by 11 week ultrasound) Antepartum complications: depression, chronic hypertension, obesity, questionable hx of a mini stroke in 2015  Delivered By:   Farrel Conners, CNM  Delivery Type:   spontaneous vaginal delivery  Procedure Details:   PAtient had undergone an IOL for chronic hypertension and had progressed rapidly from 5 cm to completely dilated. Having some variable decelerations with the contractions as she progressed. Patient pushed well delivering a 5#9oz female infant with a body cord, a left arm cord, and a right nuchal hand. Good cry at birth. Apgars 8&9. Baby taken to warmer for a low temp. +meconium after birth. After delayed cord clamping, the FOB cut the cord. Spontaneous delivery of a possible circumvallate placenta with a velamentous cord insertion and 3 vessel cord. No vaginal or perineal lacerations seen. Anesthesia:    epidural Intrapartum complications: Variable decelerations GBS:    negative Laceration:    none Episiotomy:    none Placenta:    Via active 3rd stage. To pathology: yes Estimated Blood Loss:  350 ml  Baby:    Liveborn female, Apgars 8/9, weight 5#9oz    Farrel Conners, CNM

## 2016-09-25 ENCOUNTER — Ambulatory Visit: Payer: Medicaid Other

## 2016-09-29 ENCOUNTER — Ambulatory Visit
Admission: RE | Admit: 2016-09-29 | Discharge: 2016-09-29 | Disposition: A | Discharge status: Home / Self Care | Payer: Medicaid Other | Source: Ambulatory Visit | Attending: Obstetrics & Gynecology | Admitting: Obstetrics & Gynecology

## 2016-09-29 DIAGNOSIS — Z3A25 25 weeks gestation of pregnancy: Secondary | ICD-10-CM | POA: Diagnosis not present

## 2016-09-29 DIAGNOSIS — Z369 Encounter for antenatal screening, unspecified: Secondary | ICD-10-CM

## 2016-09-29 DIAGNOSIS — Z3689 Encounter for other specified antenatal screening: Secondary | ICD-10-CM | POA: Diagnosis not present

## 2016-09-29 DIAGNOSIS — Z832 Family history of diseases of the blood and blood-forming organs and certain disorders involving the immune mechanism: Secondary | ICD-10-CM

## 2016-09-29 DIAGNOSIS — Z8751 Personal history of pre-term labor: Secondary | ICD-10-CM | POA: Insufficient documentation

## 2016-10-15 LAB — OB RESULTS CONSOLE RPR: RPR: NONREACTIVE

## 2016-12-17 ENCOUNTER — Observation Stay
Admission: EM | Admit: 2016-12-17 | Discharge: 2016-12-17 | Disposition: A | Discharge status: Home / Self Care | Payer: Medicaid Other | Attending: Obstetrics and Gynecology | Admitting: Obstetrics and Gynecology

## 2016-12-17 DIAGNOSIS — Z832 Family history of diseases of the blood and blood-forming organs and certain disorders involving the immune mechanism: Secondary | ICD-10-CM

## 2016-12-17 DIAGNOSIS — Z87891 Personal history of nicotine dependence: Secondary | ICD-10-CM | POA: Insufficient documentation

## 2016-12-17 DIAGNOSIS — Z79899 Other long term (current) drug therapy: Secondary | ICD-10-CM | POA: Diagnosis not present

## 2016-12-17 DIAGNOSIS — F329 Major depressive disorder, single episode, unspecified: Secondary | ICD-10-CM | POA: Diagnosis present

## 2016-12-17 DIAGNOSIS — Z8679 Personal history of other diseases of the circulatory system: Secondary | ICD-10-CM | POA: Insufficient documentation

## 2016-12-17 DIAGNOSIS — Z3A36 36 weeks gestation of pregnancy: Secondary | ICD-10-CM | POA: Diagnosis not present

## 2016-12-17 DIAGNOSIS — Z369 Encounter for antenatal screening, unspecified: Secondary | ICD-10-CM

## 2016-12-17 DIAGNOSIS — O99213 Obesity complicating pregnancy, third trimester: Secondary | ICD-10-CM | POA: Diagnosis present

## 2016-12-17 DIAGNOSIS — O2340 Unspecified infection of urinary tract in pregnancy, unspecified trimester: Secondary | ICD-10-CM | POA: Diagnosis not present

## 2016-12-17 DIAGNOSIS — O368130 Decreased fetal movements, third trimester, not applicable or unspecified: Secondary | ICD-10-CM

## 2016-12-17 DIAGNOSIS — O99810 Abnormal glucose complicating pregnancy: Secondary | ICD-10-CM | POA: Diagnosis present

## 2016-12-17 DIAGNOSIS — O4703 False labor before 37 completed weeks of gestation, third trimester: Principal | ICD-10-CM | POA: Insufficient documentation

## 2016-12-17 DIAGNOSIS — Z8673 Personal history of transient ischemic attack (TIA), and cerebral infarction without residual deficits: Secondary | ICD-10-CM | POA: Diagnosis not present

## 2016-12-17 DIAGNOSIS — O0993 Supervision of high risk pregnancy, unspecified, third trimester: Secondary | ICD-10-CM

## 2016-12-17 DIAGNOSIS — O219 Vomiting of pregnancy, unspecified: Secondary | ICD-10-CM | POA: Diagnosis not present

## 2016-12-17 DIAGNOSIS — Z6834 Body mass index (BMI) 34.0-34.9, adult: Secondary | ICD-10-CM

## 2016-12-17 DIAGNOSIS — Z8659 Personal history of other mental and behavioral disorders: Secondary | ICD-10-CM

## 2016-12-17 LAB — URINALYSIS, COMPLETE (UACMP) WITH MICROSCOPIC
BACTERIA UA: NONE SEEN
Bilirubin Urine: NEGATIVE
Glucose, UA: NEGATIVE mg/dL
Hgb urine dipstick: NEGATIVE
KETONES UR: NEGATIVE mg/dL
LEUKOCYTES UA: NEGATIVE
NITRITE: NEGATIVE
PROTEIN: NEGATIVE mg/dL
Specific Gravity, Urine: 1.011 (ref 1.005–1.030)
pH: 7 (ref 5.0–8.0)

## 2016-12-17 LAB — OB RESULTS CONSOLE GBS: STREP GROUP B AG: NEGATIVE

## 2016-12-17 NOTE — Plan of Care (Signed)
Dr Jean Rosenthaljackson here  For bedside ultrasound

## 2016-12-17 NOTE — Discharge Summary (Addendum)
Pt d/c'd to home in stable condition. Will follow up with [facility] on April 2nd. Discharge instructions reviewed, including labor precautions, and fetal kick counts. Verbalized understanding.

## 2016-12-17 NOTE — Plan of Care (Signed)
fhr charted on wrong pt.

## 2016-12-17 NOTE — OB Triage Note (Signed)
Pt states she is having contractions approx 10 min apart. Started last night. No leaking of fluid or vaginal bleeding

## 2016-12-17 NOTE — Final Progress Note (Signed)
Physician Final Progress Note  Patient ID: Cheryl Hines MRN: [id number] DOB/AGE: 30/10/1985 30 y.o.  Admit date: 12/17/2016 Admitting provider: Conard NovakStephen D Cia Garretson, MD Discharge date: 12/17/2016   Admission Diagnoses:  1) intrauterine pregnancy at 7931w3d  2) decreased fetal movement 3) contractions, concern for labor  Discharge Diagnoses:  1) intrauterine pregnancy at 10231w3d  2) reassuring antepartum testing 3) false labor  History of Present Illness: The patient is a 31 y.o. female G3P1 at 4731w3d who presents for decreased fetal movement and contractions. She started having painful contractions at about 830pm last night.  They were about 20 minutes apart. She also did note any fetal movement in that time.  She denies vaginal bleeding and leaking of fluid.  She denies urinary, GI, and vaginal symptoms.  Hospital Course:  She was observed on labor and delivery.  Initially, she had a non-reactive NST. A bedside ultrasound was performed for a BPP, which scored 8/8 within about 12 minutes.  Subsequently, her fetal tracing became reactive.  Upon arrival she had noted +FM and noted it throughout her stay.  She had a cervical exam which revealed a dilation of 1cm and this was stable over 2 hours.  At the time of discharge she noted a great improvement in contraction pain.  A UA was performed and was negative.   Past Medical History:  Diagnosis Date  . Anemia   . Hypertension   . Stroke (HCC)   . Vertigo     No past surgical history on file.  No current facility-administered medications on file prior to encounter.    Current Outpatient Prescriptions on File Prior to Encounter  Medication Sig Dispense Refill  . docusate sodium (COLACE) 100 MG capsule Take 1 capsule (100 mg total) by mouth 2 (two) times daily. 120 capsule 0  . ranitidine (ZANTAC) 150 MG capsule Take 150 mg by mouth 2 (two) times daily.    . sertraline (ZOLOFT) 25 MG tablet Take 25 mg by mouth daily.      Allergies:No Known Allergies  Social History   Social History  . Marital status: Single    Spouse name: N/A  . Number of children: N/A  . Years of education: N/A   Occupational History  . Not on file.   Social History Main Topics  . Smoking status: Former Smoker    Packs/day: 0.50    Types: Cigarettes    Quit date: 04/13/2016  . Smokeless tobacco: Never Used  . Alcohol use No  . Drug use: No  . Sexual activity: Yes   Other Topics Concern  . Not on file   Social History Narrative  . No narrative on file    Physical Exam: BP 121/72   Pulse (!) 104   Temp 98.3 F (36.8 C)   Ht 5\' 6"  (1.676 m)   Wt 213 lb (96.6 kg)   LMP 04/17/2016   BMI 34.38 kg/m   Gen: NAD CV: RRR Pulm: CTAB Abdomen: gravid, non-tender Pelvic: (female chaperone present), 1/30/-3 (stable over 2 hours) Ext: no e/c/t   Consults: None  Significant Findings/ Diagnostic Studies:  labs:  Lab Results  Component Value Date   APPEARANCEUR CLOUDY (A) 12/17/2016   GLUCOSEU NEGATIVE 12/17/2016   BILIRUBINUR NEGATIVE 12/17/2016   KETONESUR NEGATIVE 12/17/2016   LABSPEC 1.011 12/17/2016   HGBUR NEGATIVE 12/17/2016   PHURINE 7.0 12/17/2016   NITRITE NEGATIVE 12/17/2016   LEUKOCYTESUR NEGATIVE 12/17/2016   RBCU 0-5 12/17/2016   WBCU 0-5 12/17/2016   BACTERIA  NONE SEEN 12/17/2016   EPIU 6-30 (A) 12/17/2016   MUCOUSUACOMP PRESENT 12/17/2016     Procedures:  NST: Baseline FHR: 140 beats/min Variability: moderate Accelerations: present Decelerations: present (a single, variable deceleration from 140-120 that was short-lived (about 20 seconds) with return to baseline and moderate variability with accels. Tocometry: no activity (even with reported contractions)   Biophysical Profile: Performed by me Bedside ultrasound Amniotic fluid: at least one pocket < 2x2cm Gross body movements: > 3  Fetal tone: > 1 episode of flexion/extension Score 8/8  With NST: 10/10 (initially 8/10 for  non-reactive NST)  Discharge Condition: stable  Disposition: 01-Home or Self Care  Diet: Regular diet  Discharge Activity: Activity as tolerated   Allergies as of 12/17/2016   No Known Allergies     Medication List    TAKE these medications   docusate sodium 100 MG capsule Commonly known as:  COLACE Take 1 capsule (100 mg total) by mouth 2 (two) times daily.   ranitidine 150 MG capsule Commonly known as:  ZANTAC Take 150 mg by mouth 2 (two) times daily.   sertraline 25 MG tablet Commonly known as:  ZOLOFT Take 25 mg by mouth daily.   sertraline 50 MG tablet Commonly known as:  ZOLOFT Take 50 mg by mouth daily.      Follow-up Information    [facility] Department Follow up on 12/22/2016.   Why:  Regularly schedule prenatal appointment Contact information: [address] [place] [address] [phone]           Total time spent taking care of this patient: 45 minutes  Signed: Thomasene Mohair, MD  12/17/2016, 2:18 PM

## 2016-12-17 NOTE — Plan of Care (Signed)
Pt states she ate breakfast around 830 this am. Gave pt some ginger ale. Also gave pt the NST marker to mark movement. . Dr Jean Rosenthaljackson made aware of pt's presence on unit. Very little variability noted when initially placed on EFM . Pt turned to left side. Dr Jean Rosenthaljackson also aware.

## 2016-12-17 NOTE — Discharge Summary (Signed)
See final progress note. 

## 2016-12-20 ENCOUNTER — Observation Stay
Admission: EM | Admit: 2016-12-20 | Discharge: 2016-12-21 | Disposition: A | Discharge status: Home / Self Care | Payer: Medicaid Other | Attending: Obstetrics and Gynecology | Admitting: Obstetrics and Gynecology

## 2016-12-20 DIAGNOSIS — O99213 Obesity complicating pregnancy, third trimester: Secondary | ICD-10-CM

## 2016-12-20 DIAGNOSIS — Z0379 Encounter for other suspected maternal and fetal conditions ruled out: Principal | ICD-10-CM | POA: Insufficient documentation

## 2016-12-20 DIAGNOSIS — Z8659 Personal history of other mental and behavioral disorders: Secondary | ICD-10-CM

## 2016-12-20 DIAGNOSIS — O0993 Supervision of high risk pregnancy, unspecified, third trimester: Secondary | ICD-10-CM

## 2016-12-20 DIAGNOSIS — Z832 Family history of diseases of the blood and blood-forming organs and certain disorders involving the immune mechanism: Secondary | ICD-10-CM

## 2016-12-20 DIAGNOSIS — Z3A36 36 weeks gestation of pregnancy: Secondary | ICD-10-CM

## 2016-12-20 DIAGNOSIS — O99810 Abnormal glucose complicating pregnancy: Secondary | ICD-10-CM

## 2016-12-20 DIAGNOSIS — O219 Vomiting of pregnancy, unspecified: Secondary | ICD-10-CM

## 2016-12-20 DIAGNOSIS — Z6834 Body mass index (BMI) 34.0-34.9, adult: Secondary | ICD-10-CM

## 2016-12-21 DIAGNOSIS — Z0379 Encounter for other suspected maternal and fetal conditions ruled out: Secondary | ICD-10-CM | POA: Diagnosis not present

## 2016-12-21 NOTE — Discharge Instructions (Signed)
Braxton Hicks Contractions °Contractions of the uterus can occur throughout pregnancy, but they are not always a sign that you are in labor. You may have practice contractions called Braxton Hicks contractions. These false labor contractions are sometimes confused with true labor. °What are Braxton Hicks contractions? °Braxton Hicks contractions are tightening movements that occur in the muscles of the uterus before labor. Unlike true labor contractions, these contractions do not result in opening (dilation) and thinning of the cervix. Toward the end of pregnancy (32-34 weeks), Braxton Hicks contractions can happen more often and may become stronger. These contractions are sometimes difficult to tell apart from true labor because they can be very uncomfortable. You should not feel embarrassed if you go to the hospital with false labor. °Sometimes, the only way to tell if you are in true labor is for your health care provider to look for changes in the cervix. The health care provider will do a physical exam and may monitor your contractions. If you are not in true labor, the exam should show that your cervix is not dilating and your water has not broken. °If there are no prenatal problems or other health problems associated with your pregnancy, it is completely safe for you to be sent home with false labor. You may continue to have Braxton Hicks contractions until you go into true labor. °How can I tell the difference between true labor and false labor? °· Differences °¨ False labor °¨ Contractions last 30-70 seconds.: Contractions are usually shorter and not as strong as true labor contractions. °¨ Contractions become very regular.: Contractions are usually irregular. °¨ Discomfort is usually felt in the top of the uterus, and it spreads to the lower abdomen and low back.: Contractions are often felt in the front of the lower abdomen and in the groin. °¨ Contractions do not go away with walking.: Contractions may  go away when you walk around or change positions while lying down. °¨ Contractions usually become more intense and increase in frequency.: Contractions get weaker and are shorter-lasting as time goes on. °¨ The cervix dilates and gets thinner.: The cervix usually does not dilate or become thin. °Follow these instructions at home: °¨ Take over-the-counter and prescription medicines only as told by your health care provider. °¨ Keep up with your usual exercises and follow other instructions from your health care provider. °¨ Eat and drink lightly if you think you are going into labor. °¨ If Braxton Hicks contractions are making you uncomfortable: °¨ Change your position from lying down or resting to walking, or change from walking to resting. °¨ Sit and rest in a tub of warm water. °¨ Drink enough fluid to keep your urine clear or pale yellow. Dehydration may cause these contractions. °¨ Do slow and deep breathing several times an hour. °¨ Keep all follow-up prenatal visits as told by your health care provider. This is important. °Contact a health care provider if: °¨ You have a fever. °¨ You have continuous pain in your abdomen. °Get help right away if: °¨ Your contractions become stronger, more regular, and closer together. °¨ You have fluid leaking or gushing from your vagina. °¨ You pass blood-tinged mucus (bloody show). °¨ You have bleeding from your vagina. °¨ You have low back pain that you never had before. °¨ You feel your baby’s head pushing down and causing pelvic pressure. °¨ Your baby is not moving inside you as much as it used to. °Summary °¨ Contractions that occur before labor are   called Braxton Hicks contractions, false labor, or practice contractions.  Braxton Hicks contractions are usually shorter, weaker, farther apart, and less regular than true labor contractions. True labor contractions usually become progressively stronger and regular and they become more frequent.  Manage discomfort from  [facility] contractions by changing position, resting in a warm bath, drinking plenty of water, or practicing deep breathing. This information is not intended to replace advice given to you by your health care provider. Make sure you discuss any questions you have with your health care provider. Document Released: 09/08/2005 Document Revised: 07/28/2016 Document Reviewed: 07/28/2016 Elsevier Interactive Patient Education  2017 [employer]   LABOR: When contractions begin, you should start to time them from the beginning of one contraction to the beginning of the next.  When contractions are 5-10 minutes apart or less and have been regular for at least an hour, you should call your health care provider.  Notify your doctor if any of the following occur: 1. Bleeding from the vagina 7. Sudden, constant, or occasional abdominal pain  2. Pain or burning when urinating 8. Sudden gushing of fluid from the vagina (with or without continued leaking)  3. Chills or fever 9. Fainting spells, "black outs" or loss of consciousness  4. Increase in vaginal discharge 10. Severe or continued nausea or vomiting  5. Pelvic pressure (sudden increase) 11. Blurring of vision or spots before the eyes  6. Baby moving less than usual 12. Leaking of fluid    FETAL KICK COUNT: Lie on your left side for one hour after a meal, and count the number of times your baby kicks. If it is less than 5 times, get up, move around and drink some juice. Repeat the test 30 minutes later. If it is still less than 5 kicks in an hour, notify your doctor.

## 2016-12-21 NOTE — OB Triage Note (Addendum)
Nitrazine still negative, FHR strip reactive with some uterine irritability noted. Pt abdomen soft and non tender with no contractions palpable. FM palpated. Pt still notes pelvic discomfort. Reviewed ways to help alleviate discomfort with patient (warm bath, hydration, positioning). Pt to be discharged home per MD instructions.

## 2016-12-21 NOTE — OB Triage Note (Signed)
Pt arrived to unit complaining of contractions every 5 - 10 min starting around 7 pm on 12/21/15. +FM, no bleeding, pt unsure if membranes leaking. Pt denies HA, blurred vission or any urinary symptoms. Pt states she [place] mile [place] the park in [place] mid afternoon on 12/20/16.

## 2016-12-23 NOTE — Discharge Summary (Signed)
Patient presented for evaluation of labor.  Patient had cervical exam by RN and this was reported to me. I reviewed her vital signs and fetal tracing, both of which were reassuring.  Patient was discharge as she was not laboring.  Thomasene Mohair, MD 12/23/2016 3:20 PM

## 2016-12-26 ENCOUNTER — Observation Stay
Admission: EM | Admit: 2016-12-26 | Discharge: 2016-12-26 | Disposition: A | Discharge status: Home / Self Care | Payer: Medicaid Other | Attending: Obstetrics and Gynecology | Admitting: Obstetrics and Gynecology

## 2016-12-26 DIAGNOSIS — O4290 Premature rupture of membranes, unspecified as to length of time between rupture and onset of labor, unspecified weeks of gestation: Principal | ICD-10-CM | POA: Insufficient documentation

## 2016-12-26 DIAGNOSIS — Z3A38 38 weeks gestation of pregnancy: Secondary | ICD-10-CM | POA: Diagnosis not present

## 2016-12-26 DIAGNOSIS — O471 False labor at or after 37 completed weeks of gestation: Secondary | ICD-10-CM | POA: Insufficient documentation

## 2016-12-26 DIAGNOSIS — Z87891 Personal history of nicotine dependence: Secondary | ICD-10-CM | POA: Diagnosis not present

## 2016-12-26 DIAGNOSIS — Z8673 Personal history of transient ischemic attack (TIA), and cerebral infarction without residual deficits: Secondary | ICD-10-CM | POA: Diagnosis not present

## 2016-12-26 DIAGNOSIS — Z79899 Other long term (current) drug therapy: Secondary | ICD-10-CM | POA: Diagnosis not present

## 2016-12-26 NOTE — Discharge Instructions (Signed)
discharge instructions given with labor precautions, all questions answered.

## 2016-12-26 NOTE — Discharge Summary (Signed)
Physician Final Progress Note  Patient ID: Cheryl Hines MRN: [id number] DOB/AGE: 21-Nov-1985 31 y.o.  Admit date: 12/26/2016 Admitting provider: Tresea Mall, CNM Discharge date: 12/26/2016   Admission Diagnoses: contractions, leakage of fluid  Discharge Diagnoses:  Active Problems:   Indication for care in labor and delivery, antepartum IUP with reactive NST, membranes intact, not in labor  History of Present Illness: The patient is a 31 y.o. female G3P1 at [redacted]w[redacted]d who presents for contractions tonight that were every 3-5 minutes and feeling strong. Patient also thought her water broke tonight. Patient was admitted for observation, placed on monitors and had cervical check.  Past Medical History:  Diagnosis Date  . Anemia   . Hypertension   . Stroke (HCC)   . Vertigo     History reviewed. No pertinent surgical history.  No current facility-administered medications on file prior to encounter.    Current Outpatient Prescriptions on File Prior to Encounter  Medication Sig Dispense Refill  . docusate sodium (COLACE) 100 MG capsule Take 1 capsule (100 mg total) by mouth 2 (two) times daily. 120 capsule 0  . ondansetron (ZOFRAN) 4 MG tablet Take 4 mg by mouth every 8 (eight) hours as needed for nausea or vomiting.    . ranitidine (ZANTAC) 150 MG capsule Take 150 mg by mouth 2 (two) times daily.    . sertraline (ZOLOFT) 50 MG tablet Take 50 mg by mouth daily.      No Known Allergies  Social History   Social History  . Marital status: Single    Spouse name: N/A  . Number of children: N/A  . Years of education: N/A   Occupational History  . Not on file.   Social History Main Topics  . Smoking status: Former Smoker    Packs/day: 0.50    Types: Cigarettes    Quit date: 04/13/2016  . Smokeless tobacco: Never Used  . Alcohol use No  . Drug use: No  . Sexual activity: Yes   Other Topics Concern  . Not on file   Social History Narrative  . No narrative on  file    Physical Exam: BP 132/88   Pulse (!) 102   Temp 98.1 F (36.7 C)   LMP 04/17/2016   Gen: NAD CV: RRR Pulm: CTAB Pelvic: 3/40/-3 on admission and no change prior to discharge Membranes: nitrazine negative Toco: irregular Fetal Well Being: 130 bpm, moderate variability, +accels, -decels Ext: no evidence of DVT  Consults: None  Significant Findings/ Diagnostic Studies: none  Procedures: NST  Discharge Condition: good  Disposition: 01-Home or Self Care  Diet: Regular diet  Discharge Activity: Activity as tolerated  Discharge Instructions    Discharge activity:  No Restrictions    Complete by:  As directed    Discharge diet:  No restrictions    Complete by:  As directed    Fetal Kick Count:  Lie on our left side for one hour after a meal, and count the number of times your baby kicks.  If it is less than 5 times, get up, move around and drink some juice.  Repeat the test 30 minutes later.  If it is still less than 5 kicks in an hour, notify your doctor.    Complete by:  As directed    LABOR:  When conractions begin, you should start to time them from the beginning of one contraction to the beginning  of the next.  When contractions are 5 - 10 minutes apart or  less and have been regular for at least an hour, you should call your health care provider.    Complete by:  As directed    No sexual activity restrictions    Complete by:  As directed    Notify physician for bleeding from the vagina    Complete by:  As directed    Notify physician for blurring of vision or spots before the eyes    Complete by:  As directed    Notify physician for chills or fever    Complete by:  As directed    Notify physician for fainting spells, "black outs" or loss of consciousness    Complete by:  As directed    Notify physician for increase in vaginal discharge    Complete by:  As directed    Notify physician for leaking of fluid    Complete by:  As directed    Notify physician for  pain or burning when urinating    Complete by:  As directed    Notify physician for pelvic pressure (sudden increase)    Complete by:  As directed    Notify physician for severe or continued nausea or vomiting    Complete by:  As directed    Notify physician for sudden gushing of fluid from the vagina (with or without continued leaking)    Complete by:  As directed    Notify physician for sudden, constant, or occasional abdominal pain    Complete by:  As directed    Notify physician if baby moving less than usual    Complete by:  As directed      Allergies as of 12/26/2016   No Known Allergies     Medication List    TAKE these medications   docusate sodium 100 MG capsule Commonly known as:  COLACE Take 1 capsule (100 mg total) by mouth 2 (two) times daily.   ondansetron 4 MG tablet Commonly known as:  ZOFRAN Take 4 mg by mouth every 8 (eight) hours as needed for nausea or vomiting.   ranitidine 150 MG capsule Commonly known as:  ZANTAC Take 150 mg by mouth 2 (two) times daily.   sertraline 50 MG tablet Commonly known as:  ZOLOFT Take 50 mg by mouth daily.      Follow-up Information    [facility] Department Follow up.   Why:  go to regular scheduled prenatal appointment Contact information: [address] [place] [address] [phone]           Total time spent taking care of this patient: 15 minutes  Signed: Tresea Mall, CNM  12/26/2016, 11:48 PM

## 2017-01-12 ENCOUNTER — Inpatient Hospital Stay
Admission: EM | Admit: 2017-01-12 | Discharge: 2017-01-14 | DRG: 774 | Disposition: A | Discharge status: Home / Self Care | Payer: Medicaid Other | Attending: Certified Nurse Midwife | Admitting: Certified Nurse Midwife

## 2017-01-12 ENCOUNTER — Ambulatory Visit (INDEPENDENT_AMBULATORY_CARE_PROVIDER_SITE_OTHER): Payer: Medicaid Other | Admitting: Obstetrics and Gynecology

## 2017-01-12 ENCOUNTER — Encounter: Payer: Self-pay | Admitting: Obstetrics and Gynecology

## 2017-01-12 VITALS — BP 108/72 | HR 94 | Ht 66.0 in | Wt 219.0 lb

## 2017-01-12 DIAGNOSIS — O10019 Pre-existing essential hypertension complicating pregnancy, unspecified trimester: Secondary | ICD-10-CM

## 2017-01-12 DIAGNOSIS — Z3A40 40 weeks gestation of pregnancy: Secondary | ICD-10-CM

## 2017-01-12 DIAGNOSIS — F329 Major depressive disorder, single episode, unspecified: Secondary | ICD-10-CM | POA: Diagnosis present

## 2017-01-12 DIAGNOSIS — O99214 Obesity complicating childbirth: Secondary | ICD-10-CM | POA: Diagnosis present

## 2017-01-12 DIAGNOSIS — Z6835 Body mass index (BMI) 35.0-35.9, adult: Secondary | ICD-10-CM

## 2017-01-12 DIAGNOSIS — O1002 Pre-existing essential hypertension complicating childbirth: Secondary | ICD-10-CM | POA: Diagnosis present

## 2017-01-12 DIAGNOSIS — Z87891 Personal history of nicotine dependence: Secondary | ICD-10-CM

## 2017-01-12 DIAGNOSIS — O24420 Gestational diabetes mellitus in childbirth, diet controlled: Secondary | ICD-10-CM | POA: Diagnosis present

## 2017-01-12 DIAGNOSIS — O99344 Other mental disorders complicating childbirth: Secondary | ICD-10-CM | POA: Diagnosis present

## 2017-01-12 DIAGNOSIS — O99810 Abnormal glucose complicating pregnancy: Secondary | ICD-10-CM

## 2017-01-12 DIAGNOSIS — Z832 Family history of diseases of the blood and blood-forming organs and certain disorders involving the immune mechanism: Secondary | ICD-10-CM

## 2017-01-12 DIAGNOSIS — O43123 Velamentous insertion of umbilical cord, third trimester: Secondary | ICD-10-CM | POA: Diagnosis present

## 2017-01-12 DIAGNOSIS — Z3A36 36 weeks gestation of pregnancy: Secondary | ICD-10-CM

## 2017-01-12 DIAGNOSIS — E669 Obesity, unspecified: Secondary | ICD-10-CM | POA: Diagnosis present

## 2017-01-12 DIAGNOSIS — O99213 Obesity complicating pregnancy, third trimester: Secondary | ICD-10-CM

## 2017-01-12 DIAGNOSIS — Z8673 Personal history of transient ischemic attack (TIA), and cerebral infarction without residual deficits: Secondary | ICD-10-CM | POA: Diagnosis not present

## 2017-01-12 DIAGNOSIS — O09813 Supervision of pregnancy resulting from assisted reproductive technology, third trimester: Secondary | ICD-10-CM

## 2017-01-12 DIAGNOSIS — Z6834 Body mass index (BMI) 34.0-34.9, adult: Secondary | ICD-10-CM

## 2017-01-12 DIAGNOSIS — O0993 Supervision of high risk pregnancy, unspecified, third trimester: Secondary | ICD-10-CM

## 2017-01-12 DIAGNOSIS — O219 Vomiting of pregnancy, unspecified: Secondary | ICD-10-CM

## 2017-01-12 DIAGNOSIS — Z8659 Personal history of other mental and behavioral disorders: Secondary | ICD-10-CM

## 2017-01-12 LAB — TYPE AND SCREEN
ABO/RH(D): O POS
ANTIBODY SCREEN: NEGATIVE

## 2017-01-12 LAB — CBC
HCT: 37.4 % (ref 35.0–47.0)
HEMOGLOBIN: 12.4 g/dL (ref 12.0–16.0)
MCH: 26.8 pg (ref 26.0–34.0)
MCHC: 33.2 g/dL (ref 32.0–36.0)
MCV: 80.7 fL (ref 80.0–100.0)
Platelets: 139 10*3/uL — ABNORMAL LOW (ref 150–440)
RBC: 4.63 MIL/uL (ref 3.80–5.20)
RDW: 16.9 % — ABNORMAL HIGH (ref 11.5–14.5)
WBC: 7.4 10*3/uL (ref 3.6–11.0)

## 2017-01-12 MED ORDER — TERBUTALINE SULFATE 1 MG/ML IJ SOLN: 0.2500 mg | Freq: Once | INTRAMUSCULAR | Status: DC | PRN

## 2017-01-12 MED ORDER — ONDANSETRON HCL 4 MG/2ML IJ SOLN
4.0000 mg | Freq: Four times a day (QID) | INTRAMUSCULAR | Status: DC | PRN
  Administered 2017-01-13: 4 mg via INTRAVENOUS
  Filled 2017-01-12: qty 2

## 2017-01-12 MED ORDER — OXYTOCIN BOLUS FROM INFUSION
500.0000 mL | Freq: Once | INTRAVENOUS | Status: AC
  Administered 2017-01-13: 500 mL via INTRAVENOUS

## 2017-01-12 MED ORDER — BUTORPHANOL TARTRATE 1 MG/ML IJ SOLN
2.0000 mg | INTRAMUSCULAR | Status: DC | PRN
  Administered 2017-01-13: 1 mg via INTRAVENOUS
  Filled 2017-01-12: qty 1

## 2017-01-12 MED ORDER — LACTATED RINGERS IV SOLN: 500.0000 mL | INTRAVENOUS | Status: DC | PRN

## 2017-01-12 MED ORDER — LACTATED RINGERS IV SOLN
INTRAVENOUS | Status: DC
Start: 2017-01-12 — End: 2017-01-13
  Administered 2017-01-12: 17:00:00 via INTRAVENOUS

## 2017-01-12 MED ORDER — OXYTOCIN 40 UNITS IN LACTATED RINGERS INFUSION - SIMPLE MED
INTRAVENOUS | Status: AC
  Administered 2017-01-12: 2 m[IU]/min via INTRAVENOUS
  Filled 2017-01-12: qty 1000

## 2017-01-12 MED ORDER — OXYTOCIN 40 UNITS IN LACTATED RINGERS INFUSION - SIMPLE MED
1.0000 m[IU]/min | INTRAVENOUS | Status: DC
  Administered 2017-01-12: 10 m[IU]/min via INTRAVENOUS
  Administered 2017-01-12: 8 m[IU]/min via INTRAVENOUS
  Administered 2017-01-12: 6 m[IU]/min via INTRAVENOUS
  Administered 2017-01-12: 2 m[IU]/min via INTRAVENOUS

## 2017-01-12 MED ORDER — OXYTOCIN 40 UNITS IN LACTATED RINGERS INFUSION - SIMPLE MED: 2.5000 [IU]/h | INTRAVENOUS | Status: DC

## 2017-01-12 MED ORDER — OXYTOCIN 10 UNIT/ML IJ SOLN
INTRAMUSCULAR | Status: AC
  Filled 2017-01-12: qty 2

## 2017-01-12 MED ORDER — AMMONIA AROMATIC IN INHA
RESPIRATORY_TRACT | Status: AC
  Filled 2017-01-12: qty 10

## 2017-01-12 MED ORDER — LIDOCAINE HCL (PF) 1 % IJ SOLN
30.0000 mL | INTRAMUSCULAR | Status: DC | PRN
  Filled 2017-01-12: qty 30

## 2017-01-12 MED ORDER — MISOPROSTOL 200 MCG PO TABS
ORAL_TABLET | ORAL | Status: AC
  Filled 2017-01-12: qty 4

## 2017-01-12 NOTE — Progress Notes (Signed)
Cheryl Hines here for IOL for chronic HTN. Pt reports last meal 1 hour ago, denies bleeding, LOF, reports positive fetal movement.

## 2017-01-12 NOTE — Progress Notes (Signed)
Obstetric H&P   Chief Complaint: IOL for chronic hypertension  Prenatal Care Provider: [facility]  History of Present Illness: 31 y.o. [id number] [redacted]w[redacted]d by 01/09/2017, by Patient Reported presenting to L&Dfor postdates IOL secondary to chronic hypertension  O pos / ABSC neg / RI / VZI / HIV neg / RPR NR / GBS negative  Review of Systems: 10 point review of systems negative unless otherwise noted in HPI  Past Medical History: Past Medical History:  Diagnosis Date  . Anemia   . Hypertension   . Stroke (HCC)   . Vertigo     Past Surgical History: History reviewed. No pertinent surgical history.  Family History: History reviewed. No pertinent family history.  Social History: Social History   Social History  . Marital status: Single    Spouse name: N/A  . Number of children: N/A  . Years of education: N/A   Occupational History  . Not on file.   Social History Main Topics  . Smoking status: Former Smoker    Packs/day: 0.50    Types: Cigarettes    Quit date: 04/13/2016  . Smokeless tobacco: Never Used  . Alcohol use No  . Drug use: No  . Sexual activity: Yes   Other Topics Concern  . Not on file   Social History Narrative  . No narrative on file    Medications: Prior to Admission medications   Medication Sig Start Date End Date Taking? Authorizing Provider  docusate sodium (COLACE) 100 MG capsule Take 1 capsule (100 mg total) by mouth 2 (two) times daily. 09/03/16   Sharman Cheek, MD  ondansetron (ZOFRAN) 4 MG tablet Take 4 mg by mouth every 8 (eight) hours as needed for nausea or vomiting.    Historical Provider, MD  ranitidine (ZANTAC) 150 MG capsule Take 150 mg by mouth 2 (two) times daily.    Historical Provider, MD  sertraline (ZOLOFT) 50 MG tablet Take 50 mg by mouth daily.    Historical Provider, MD    Allergies: No Known Allergies  Physical Exam: Vitals: Blood pressure 108/72, pulse 94, height  (1.676 m), weight 219 lb (99.3 kg), last menstrual  period 04/17/2016.  General: NAD HEENT: normocephalic, anicteric Pulmonary: No increased work of breathing Cardiovascular: RRR, distal pulses 2+ Abdomen: Gravid, non-tender Leopolds: 8lbs Genitourinary: 4cm at last cervical check Extremities: no edema, erythema, or tenderness Neurologic: Grossly intact Psychiatric: mood appropriate, affect full  Labs: No results found for this or any previous visit (from the past 24 hour(s)).  Assessment: 31 y.o. G4P0011 [redacted]w[redacted]d by 01/09/2017, IOL CHTN  Plan: 1) Pitocin IOL for postdates and CHTN  2) PNL -see prenatal records  3) TDAP - UTD  4) Disposition - pending delivery

## 2017-01-12 NOTE — Progress Notes (Addendum)
Date of Initial H&P: today  History reviewed, patient examined, update as below:  31 year old G3 P1011 presents for induction of labor at [place] days (EDC=01/11/2017 by a 11 week ultrasound) for chronic hypertension, not on medication. Prenatal care at [facility] also remarkable for depression, reflux, a possible mini stroke in 2015 (seen for a headache and weakness on her left side, with a negative CT,  has not had any chronic deficit, and obesity ([id number]) with a 6# weight loss. She was on a BAby ASA daily and stopped that 1-2 months ago. Current medications include Zofran, Zantac, Colace, prenatal vitamin,  and Zoloft. Desires to bottle feed. Had a TDAP during the pregnancy. O POS/ RI (MMR x2)/ and VI. GBS is negative  Exam: BP 130/74 (BP Location: Left Arm)   Pulse 91   Temp 97.7 F (36.5 C) (Oral)   Resp 16   Ht '5\' 6"'$  (1.676 m)   Wt 99.3 kg (219 lb)   LMP 04/17/2016   BMI 35.35 kg/m   Abdomen: ROT/ EFW 8# Cervix: 3.5cm/ 50-75%/ -1/ mid FHR: 140 baseline with accelerations to 155 to 160, with occasional 10-20 sec variable, moderate variability Toco: occasional contraction, patient not feeling any painful contractions  A: IUP at 40wk3d with CHTN-normotensive and not on any medications-for IOL Bishop score 7 FWB: reactive, Cat1  P: Discussed with patient induction with Pitocin. Aware of the risks of hyperstimulation and intolerance to labor as well as risk of Cesarean section. She wishes to proceed.  Continuous fetal heart rate monitoring Monitor blood pressures. O POS/ RI/ VI/ GBS negative Bottle/ Mirena  Dalia Heading, CNM

## 2017-01-13 ENCOUNTER — Encounter: Payer: Self-pay | Admitting: Certified Nurse Midwife

## 2017-01-13 ENCOUNTER — Inpatient Hospital Stay: Payer: Medicaid Other | Admitting: Anesthesiology

## 2017-01-13 DIAGNOSIS — Z3A40 40 weeks gestation of pregnancy: Secondary | ICD-10-CM

## 2017-01-13 DIAGNOSIS — O1002 Pre-existing essential hypertension complicating childbirth: Secondary | ICD-10-CM

## 2017-01-13 LAB — RPR: RPR Ser Ql: NONREACTIVE

## 2017-01-13 MED ORDER — ONDANSETRON HCL 4 MG PO TABS: 4.0000 mg | ORAL_TABLET | ORAL | Status: DC | PRN

## 2017-01-13 MED ORDER — BUPIVACAINE HCL (PF) 0.25 % IJ SOLN
INTRAMUSCULAR | Status: DC | PRN
  Administered 2017-01-13 (×3): 5 mL via EPIDURAL

## 2017-01-13 MED ORDER — SENNOSIDES-DOCUSATE SODIUM 8.6-50 MG PO TABS: 2.0000 | ORAL_TABLET | ORAL | Status: DC

## 2017-01-13 MED ORDER — BENZOCAINE-MENTHOL 20-0.5 % EX AERO: 1.0000 "application " | INHALATION_SPRAY | CUTANEOUS | Status: DC | PRN

## 2017-01-13 MED ORDER — FERROUS SULFATE 325 (65 FE) MG PO TABS
325.0000 mg | ORAL_TABLET | Freq: Every day | ORAL | Status: DC
  Administered 2017-01-13 – 2017-01-14 (×2): 325 mg via ORAL
  Filled 2017-01-13 (×2): qty 1

## 2017-01-13 MED ORDER — WITCH HAZEL-GLYCERIN EX PADS: 1.0000 "application " | MEDICATED_PAD | CUTANEOUS | Status: DC | PRN

## 2017-01-13 MED ORDER — IBUPROFEN 600 MG PO TABS
600.0000 mg | ORAL_TABLET | Freq: Four times a day (QID) | ORAL | Status: DC
  Administered 2017-01-13 – 2017-01-14 (×5): 600 mg via ORAL
  Filled 2017-01-13 (×5): qty 1

## 2017-01-13 MED ORDER — FAMOTIDINE 20 MG PO TABS
ORAL_TABLET | ORAL | Status: AC
  Administered 2017-01-13: 20 mg via ORAL
  Filled 2017-01-13: qty 1

## 2017-01-13 MED ORDER — DIBUCAINE 1 % RE OINT: 1.0000 "application " | TOPICAL_OINTMENT | RECTAL | Status: DC | PRN

## 2017-01-13 MED ORDER — FAMOTIDINE 20 MG PO TABS
20.0000 mg | ORAL_TABLET | Freq: Two times a day (BID) | ORAL | Status: DC | PRN
  Administered 2017-01-13: 20 mg via ORAL

## 2017-01-13 MED ORDER — ALUM & MAG HYDROXIDE-SIMETH 200-200-20 MG/5ML PO SUSP: 30.0000 mL | Freq: Four times a day (QID) | ORAL | Status: DC | PRN

## 2017-01-13 MED ORDER — LIDOCAINE-EPINEPHRINE (PF) 1.5 %-1:200000 IJ SOLN
INTRAMUSCULAR | Status: DC | PRN
  Administered 2017-01-13: 3 mL

## 2017-01-13 MED ORDER — PRENATAL MULTIVITAMIN CH
1.0000 | ORAL_TABLET | Freq: Every day | ORAL | Status: DC
  Administered 2017-01-13: 1 via ORAL
  Filled 2017-01-13: qty 1

## 2017-01-13 MED ORDER — FENTANYL 2.5 MCG/ML W/ROPIVACAINE 0.2% IN NS 100 ML EPIDURAL INFUSION (ARMC-ANES)
EPIDURAL | Status: DC | PRN
  Administered 2017-01-13: 10 mL/h via EPIDURAL

## 2017-01-13 MED ORDER — ONDANSETRON HCL 4 MG/2ML IJ SOLN: 4.0000 mg | INTRAMUSCULAR | Status: DC | PRN

## 2017-01-13 MED ORDER — FENTANYL 2.5 MCG/ML W/ROPIVACAINE 0.2% IN NS 100 ML EPIDURAL INFUSION (ARMC-ANES)
EPIDURAL | Status: AC
  Filled 2017-01-13: qty 100

## 2017-01-13 MED ORDER — SERTRALINE HCL 25 MG PO TABS
50.0000 mg | ORAL_TABLET | Freq: Every day | ORAL | Status: DC
  Administered 2017-01-13 – 2017-01-14 (×2): 50 mg via ORAL
  Filled 2017-01-13: qty 2
  Filled 2017-01-13: qty 1

## 2017-01-13 MED ORDER — OXYCODONE HCL 5 MG PO TABS: 5.0000 mg | ORAL_TABLET | ORAL | Status: DC | PRN

## 2017-01-13 MED ORDER — SIMETHICONE 80 MG PO CHEW: 80.0000 mg | CHEWABLE_TABLET | ORAL | Status: DC | PRN

## 2017-01-13 MED ORDER — COCONUT OIL OIL: 1.0000 "application " | TOPICAL_OIL | Status: DC | PRN

## 2017-01-13 NOTE — Anesthesia Preprocedure Evaluation (Signed)
Anesthesia Evaluation  Patient identified by MRN, date of birth, ID band Patient awake    Reviewed: Allergy & Precautions, NPO status , Patient's Chart, lab work & pertinent test results  History of Anesthesia Complications Negative for: history of anesthetic complications  Airway Mallampati: II  TM Distance: >3 FB Neck ROM: Full    Dental no notable dental hx.    Pulmonary neg sleep apnea, neg COPD, former smoker,    breath sounds clear to auscultation- rhonchi (-) wheezing      Cardiovascular hypertension (cHTN, not on medications), (-) CAD and (-) Past MI  Rhythm:Regular Rate:Normal - Systolic murmurs and - Diastolic murmurs    Neuro/Psych PSYCHIATRIC DISORDERS Depression negative neurological ROS     GI/Hepatic negative GI ROS, Neg liver ROS,   Endo/Other  negative endocrine ROSneg diabetes  Renal/GU negative Renal ROS     Musculoskeletal negative musculoskeletal ROS (+)   Abdominal (+) + obese, Gravid abdomen   Peds  Hematology  (+) anemia ,   Anesthesia Other Findings Past Medical History: No date: Anemia No date: Hypertension No date: Stroke [facility]) No date: Vertigo   Reproductive/Obstetrics (+) Pregnancy                             Anesthesia Physical Anesthesia Plan  ASA: II  Anesthesia Plan: Epidural   Post-op Pain Management:    Induction:   Airway Management Planned:   Additional Equipment:   Intra-op Plan:   Post-operative Plan:   Informed Consent: I have reviewed the patients History and Physical, chart, labs and discussed the procedure including the risks, benefits and alternatives for the proposed anesthesia with the patient or authorized representative who has indicated his/her understanding and acceptance.     Plan Discussed with: Anesthesiologist  Anesthesia Plan Comments: (Plan for epidural for labor, discussed epidural vs spinal vs GA if need for  csection)        Lab Results  Component Value Date   WBC 7.4 01/12/2017   HGB 12.4 01/12/2017   HCT 37.4 01/12/2017   MCV 80.7 01/12/2017   PLT 139 (L) 01/12/2017    Anesthesia Quick Evaluation

## 2017-01-13 NOTE — Discharge Instructions (Signed)
Vaginal Delivery, Care After Refer to this sheet in the next few weeks. These discharge instructions provide you with information on caring for yourself after delivery. Your caregiver may also give you specific instructions. Your treatment has been planned according to the most current medical practices available, but problems sometimes occur. Call your caregiver if you have any problems or questions after you go home. HOME CARE INSTRUCTIONS 1. Take over-the-counter or prescription medicines only as directed by your caregiver or pharmacist. 2. Do not drink alcohol, especially if you are breastfeeding or taking medicine to relieve pain. 3. Do not smoke tobacco. 4. Continue to use good perineal care. Good perineal care includes: 1. Wiping your perineum from back to front 2. Keeping your perineum clean. 3. You can do sitz baths twice a day, to help keep this area clean 5. Do not use tampons, douche or have sex until your caregiver says it is okay. 6. Shower only and avoid sitting in submerged water, aside from sitz baths 7. Wear a well-fitting bra that provides breast support. 8. Eat healthy foods. 9. Drink enough fluids to keep your urine clear or pale yellow. 10. Eat high-fiber foods such as whole grain cereals and breads, brown rice, beans, and fresh fruits and vegetables every day. These foods may help prevent or relieve constipation. 11. Avoid constipation with high fiber foods or medications, such as miralax or metamucil 12. Follow your caregiver's recommendations regarding resumption of activities such as climbing stairs, driving, lifting, exercising, or traveling. 13. Talk to your caregiver about resuming sexual activities. Resumption of sexual activities is dependent upon your risk of infection, your rate of healing, and your comfort and desire to resume sexual activity. 14. Try to have someone help you with your household activities and your newborn for at least a few days after you leave  the hospital. 15. Rest as much as possible. Try to rest or take a nap when your newborn is sleeping. 16. Increase your activities gradually. 17. Keep all of your scheduled postpartum appointments. It is very important to keep your scheduled follow-up appointments. At these appointments, your caregiver will be checking to make sure that you are healing physically and emotionally. SEEK MEDICAL CARE IF:   You are passing large clots from your vagina. Save any clots to show your caregiver.  You have a foul smelling discharge from your vagina.  You have trouble urinating.  You are urinating frequently.  You have pain when you urinate.  You have a change in your bowel movements.  You have increasing redness, pain, or swelling near your vaginal incision (episiotomy) or vaginal tear.  You have pus draining from your episiotomy or vaginal tear.  Your episiotomy or vaginal tear is separating.  You have painful, hard, or reddened breasts.  You have a severe headache.  You have blurred vision or see spots.  You feel sad or depressed.  You have thoughts of hurting yourself or your newborn.  You have questions about your care, the care of your newborn, or medicines.  You are dizzy or light-headed.  You have a rash.  You have nausea or vomiting.  You were breastfeeding and have not had a menstrual period within 12 weeks after you stopped breastfeeding.  You are not breastfeeding and have not had a menstrual period by the 12th week after delivery.  You have a fever. SEEK IMMEDIATE MEDICAL CARE IF:   You have persistent pain.  You have chest pain.  You have shortness of breath.    You faint.  You have leg pain.  You have stomach pain.  Your vaginal bleeding saturates two or more sanitary pads in 1 hour. MAKE SURE YOU:   Understand these instructions.  Will watch your condition.  Will get help right away if you are not doing well or get worse. Document Released:  09/05/2000 Document Revised: 01/23/2014 Document Reviewed: 05/05/2012 ExitCare Patient Information 2015 [employer]. This information is not intended to replace advice given to you by your health care provider. Make sure you discuss any questions you have with your health care provider.  Sitz Bath A sitz bath is a warm water bath taken in the sitting position. The water covers only the hips and butt (buttocks). We recommend using one that fits in the toilet, to help with ease of use and cleanliness. It may be used for either healing or cleaning purposes. Sitz baths are also used to relieve pain, itching, or muscle tightening (spasms). The water may contain medicine. Moist heat will help you heal and relax.  HOME CARE  Take 3 to 4 sitz baths a day. 18. Fill the bathtub half-full with warm water. 19. Sit in the water and open the drain a little. 20. Turn on the warm water to keep the tub half-full. Keep the water running constantly. 21. Soak in the water for 15 to 20 minutes. 22. After the sitz bath, pat the affected area dry. GET HELP RIGHT AWAY IF: You get worse instead of better. Stop the sitz baths if you get worse. MAKE SURE YOU:  Understand these instructions.  Will watch your condition.  Will get help right away if you are not doing well or get worse. Document Released: 10/16/2004 Document Revised: 06/02/2012 Document Reviewed: 01/06/2011 ExitCare Patient Information 2015 [employer]. This information is not intended to replace advice given to you by your health care provider. Make sure you discuss any questions you have with your health care provider.    

## 2017-01-13 NOTE — Discharge Summary (Signed)
Physician Obstetric Discharge Summary  Patient ID: Cheryl Hines MRN: [id number] DOB/AGE: 22-Mar-1986 30 y.o.   Date of Admission: 01/12/2017  Date of Discharge: 01/13/17  Admitting Diagnosis: Induction of labor at [redacted]w[redacted]d  Secondary Diagnosis: Chronic hypertension / presumed GDMA1  Mode of Delivery: normal spontaneous vaginal delivery 01/13/2017      Discharge Diagnosis: Term intrauterine pregnancy delivered at 40wk4d   Intrapartum Procedures: epidural, Pitocin induction, intrauterine pressure catheter   Post partum procedures: none  Complications: none   Brief Hospital Course  Cheryl Hines is a [id number] who had a SVD on 01/13/2017 after a Pitocin induction for chronic hypertension at term.;  for further details of this delivery, please refer to the delivery note.  Patient had an uncomplicated postpartum course.  By time of discharge on PPD#1, her pain was controlled on oral pain medications; she had appropriate lochia and was ambulating, voiding without difficulty and tolerating regular diet. No HTN concerns since delivery.  She was deemed stable for discharge to home.     Labs: CBC Latest Ref Rng & Units 01/14/2017 01/12/2017 09/03/2016  WBC 3.6 - 11.0 K/uL 9.3 7.4 7.4  Hemoglobin 12.0 - 16.0 g/dL 11.2(L) 12.4 11.7(L)  Hematocrit 35.0 - 47.0 % 33.3(L) 37.4 34.9(L)  Platelets 150 - 440 K/uL 118(L) 139(L) 173   O POS  Physical exam:  Blood pressure 119/73, pulse 75, temperature 98.1 F (36.7 C), temperature source Oral, resp. rate 18, height  (1.676 m), weight 219 lb (99.3 kg), last menstrual period 04/17/2016, SpO2 100 %, unknown if currently breastfeeding. General: alert and no distress Lochia: appropriate Abdomen: soft, NT Uterine Fundus: firm Extremities: No evidence of DVT seen on physical exam. No lower extremity edema.  Discharge Instructions: Per After Visit Summary. Activity: Advance as tolerated. Pelvic rest for 6 weeks.  Also refer to  Discharge Instructions Diet: Regular Medications: Allergies as of 01/14/2017   No Known Allergies     Medication List    STOP taking these medications   ondansetron 4 MG tablet Commonly known as:  ZOFRAN     TAKE these medications   docusate sodium 100 MG capsule Commonly known as:  COLACE Take 1 capsule (100 mg total) by mouth 2 (two) times daily.   MULTI PRENATAL PO Take by mouth.   ranitidine 150 MG capsule Commonly known as:  ZANTAC Take 150 mg by mouth 2 (two) times daily.   sertraline 50 MG tablet Commonly known as:  ZOLOFT Take 50 mg by mouth daily.      Outpatient follow up:  Follow-up Information    [facility] Department. Schedule an appointment as soon as possible for a visit today.   Why:  for a 2 week check (hx of depression on Zoloft) Contact information: [address] [place] [address] [phone]          Postpartum contraception: Mirena IUD  Discharged Condition: good; signs and sx's of HTN discussed with patient  Discharged to: home   Newborn Data: Disposition:home with mother  Apgars: APGAR (1 MIN): 8   APGAR (5 MINS): 9   Baby Feeding: Bottle  Letitia Libra, MD 01/14/2017 8:56 AM

## 2017-01-13 NOTE — Anesthesia Procedure Notes (Signed)
Epidural Patient location during procedure: OB Start time: 01/13/2017 1:57 AM End time: 01/13/2017 2:14 AM  Staffing Anesthesiologist: Alver Fisher Performed: anesthesiologist   Preanesthetic Checklist Completed: patient identified, site marked, surgical consent, pre-op evaluation, timeout performed, IV checked, risks and benefits discussed and monitors and equipment checked  Epidural Patient position: sitting Prep: ChloraPrep Patient monitoring: heart rate, continuous pulse ox and blood pressure Approach: midline Location: L3-L4 Injection technique: LOR saline  Needle:  Needle type: Tuohy  Needle gauge: 18 G Needle length: 9 cm and 9 Needle insertion depth: 7 cm Catheter type: closed end flexible Catheter size: 20 Guage Catheter at skin depth: 11 cm Test dose: negative (0.125% bupivacaine)  Assessment Events: blood not aspirated, injection not painful, no injection resistance, negative IV test and no paresthesia  Additional Notes   Patient tolerated the insertion well without complications.Reason for block:procedure for pain

## 2017-01-14 LAB — CBC
HEMATOCRIT: 33.3 % — AB (ref 35.0–47.0)
HEMOGLOBIN: 11.2 g/dL — AB (ref 12.0–16.0)
MCH: 27.1 pg (ref 26.0–34.0)
MCHC: 33.6 g/dL (ref 32.0–36.0)
MCV: 80.5 fL (ref 80.0–100.0)
Platelets: 118 10*3/uL — ABNORMAL LOW (ref 150–440)
RBC: 4.14 MIL/uL (ref 3.80–5.20)
RDW: 16.9 % — ABNORMAL HIGH (ref 11.5–14.5)
WBC: 9.3 10*3/uL (ref 3.6–11.0)

## 2017-01-14 LAB — SURGICAL PATHOLOGY

## 2017-01-14 NOTE — Anesthesia Postprocedure Evaluation (Signed)
Anesthesia Post Note  Patient: Cheryl Hines  Procedure(s) Performed: * No procedures listed *  Patient location during evaluation: Mother Baby Anesthesia Type: Epidural Level of consciousness: awake, awake and alert and oriented Pain management: pain level controlled Vital Signs Assessment: post-procedure vital signs reviewed and stable Respiratory status: spontaneous breathing and nonlabored ventilation Cardiovascular status: blood pressure returned to baseline and stable Postop Assessment: no headache and no backache Anesthetic complications: no     Last Vitals:  Vitals:   01/14/17 0354 01/14/17 0722  BP: 117/68 119/73  Pulse: 83 75  Resp: 18 18  Temp: 36.9 C 36.7 C    Last Pain:  Vitals:   01/14/17 0722  TempSrc: Oral  PainSc:                  Ginger Carne

## 2017-01-14 NOTE — Progress Notes (Signed)
Admit Date: 01/12/2017 Today's Date: 01/14/2017  Post Partum Day 1  Subjective:  no complaints, up ad lib, voiding and tolerating PO  Objective: Temp:  [97.9 F (36.6 C)-98.4 F (36.9 C)] 98.1 F (36.7 C) (04/25 0722) Pulse Rate:  [75-83] 75 (04/25 0722) Resp:  [17-19] 18 (04/25 0722) BP: (111-121)/(60-73) 119/73 (04/25 0722) SpO2:  [96 %-100 %] 100 % (04/25 0722)  Physical Exam:  General: alert, cooperative and no distress Lochia: appropriate Uterine Fundus: firm Incision: none DVT Evaluation: No evidence of DVT seen on physical exam. Negative Homan's sign.   Recent Labs  01/12/17 1720 01/14/17 0551  HGB 12.4 11.2*  HCT 37.4 33.3*    Assessment/Plan: Discharge home, Bottle Feeding and Infant doing well   LOS: 2 days   Cheryl Hines [facility] 01/14/2017, 8:57 AM

## 2017-01-14 NOTE — Progress Notes (Signed)
Pt discharged home with infant.  Discharge instructions and follow up appointment given to and reviewed with pt.  Pt verbalized understanding.  Escorted by auxillary. 

## 2017-01-14 NOTE — Progress Notes (Deleted)
Newborn discharged home.  Discharge instructions and appointment given to and reviewed with parent.  Parent verbalized understanding.  Tag removed, escorted by auxillary, carseat present. 

## 2017-02-28 ENCOUNTER — Emergency Department
Admission: EM | Admit: 2017-02-28 | Discharge: 2017-02-28 | Disposition: A | Discharge status: Home / Self Care | Payer: Medicaid Other | Attending: Emergency Medicine | Admitting: Emergency Medicine

## 2017-02-28 ENCOUNTER — Encounter: Payer: Self-pay | Admitting: Emergency Medicine

## 2017-02-28 DIAGNOSIS — T8383XA Hemorrhage of genitourinary prosthetic devices, implants and grafts, initial encounter: Secondary | ICD-10-CM | POA: Diagnosis not present

## 2017-02-28 DIAGNOSIS — I10 Essential (primary) hypertension: Secondary | ICD-10-CM | POA: Insufficient documentation

## 2017-02-28 DIAGNOSIS — Y768 Miscellaneous obstetric and gynecological devices associated with adverse incidents, not elsewhere classified: Secondary | ICD-10-CM | POA: Diagnosis not present

## 2017-02-28 DIAGNOSIS — N939 Abnormal uterine and vaginal bleeding, unspecified: Secondary | ICD-10-CM

## 2017-02-28 DIAGNOSIS — Z87891 Personal history of nicotine dependence: Secondary | ICD-10-CM | POA: Insufficient documentation

## 2017-02-28 NOTE — ED Triage Notes (Signed)
Pt reports IUD placed yesterday.  Started with heavy vaginal bleeding today with clots.  Been since 7 am.  Reports changing pad 2 X per hour and it is soaked.

## 2017-02-28 NOTE — ED Provider Notes (Signed)
[facility] Emergency Department Provider Note  ____________________________________________  Time seen: Approximately 5:37 PM  I have reviewed the triage vital signs and the nursing notes.   HISTORY  Chief Complaint Vaginal Bleeding    HPI Cheryl Hines is a 31 y.o. female who complains of vaginal bleeding that started about 7 AM today. Has changed multiple pads today. No dizziness syncope chest pain shortness of breath or generalized weakness. She is about 6 weeks postpartum, had an IUD placed yesterday. Not breast-feeding, infant is bottle fed and doing well. Denies any other complaints. No abnormal vaginal discharge. No dysuria.     Past Medical History:  Diagnosis Date  . Anemia   . Hypertension   . Stroke (HCC)   . Vertigo      Patient Active Problem List   Diagnosis Date Noted  . Postpartum care following vaginal delivery 01/13/2017  . Major depression 12/17/2016  . History of posttraumatic stress disorder (PTSD) 12/17/2016  . BMI 34.0-34.9,adult 12/17/2016  . Family history of sickle cell trait 07/17/2016     History reviewed. No pertinent surgical history.   Prior to Admission medications   Medication Sig Start Date End Date Taking? Authorizing Provider  docusate sodium (COLACE) 100 MG capsule Take 1 capsule (100 mg total) by mouth 2 (two) times daily. 09/03/16   Sharman CheekStafford, Honestii Marton, MD  Prenatal Vit-Fe Fumarate-FA [facility](MULTI PRENATAL PO) Take by mouth.    [provider]  ranitidine (ZANTAC) 150 MG capsule Take 150 mg by mouth 2 (two) times daily.    [provider]  sertraline (ZOLOFT) 50 MG tablet Take 50 mg by mouth daily.    [provider]     Allergies Patient has no known allergies.   History reviewed. No pertinent family history.  Social History Social History  Substance Use Topics  . Smoking status: Former Smoker    Packs/day: 0.50    Types: Cigarettes    Quit date: 04/13/2016   . Smokeless tobacco: Never Used  . Alcohol use No    Review of Systems  Constitutional:   No fever or chills.  ENT:   No sore throat. No rhinorrhea. Cardiovascular:   No chest pain or syncope. Respiratory:   No dyspnea or cough. Gastrointestinal:   Negative for abdominal pain, vomiting and diarrhea.  Musculoskeletal:   Negative for focal pain or swelling All other systems reviewed and are negative except as documented above in ROS and HPI.  ____________________________________________   PHYSICAL EXAM:  VITAL SIGNS: ED Triage Vitals  Enc Vitals Group     BP 02/28/17 1542 (!) 125/104     Pulse Rate 02/28/17 1542 (!) 104     Resp 02/28/17 1542 18     Temp 02/28/17 1542 98 F (36.7 C)     Temp Source 02/28/17 1542 Oral     SpO2 02/28/17 1542 98 %     Weight 02/28/17 1543 203 lb (92.1 kg)     Height 02/28/17 1543 5\' 6"  (1.676 m)     Head Circumference --      Peak Flow --      Pain Score 02/28/17 1551 8     Pain Loc --      Pain Edu? --      Excl. in GC? --     Vital signs reviewed, nursing assessments reviewed.   Constitutional:   Alert and oriented. Well appearing and in no distress. Eyes:   No scleral icterus.  EOMI. No nystagmus. No  conjunctival pallor. PERRL. ENT   Head:   Normocephalic and atraumatic.   Nose:   No congestion/rhinnorhea.    Mouth/Throat:   MMM, no pharyngeal erythema. No peritonsillar mass.    Neck:   No meningismus. Full ROM Hematological/Lymphatic/Immunilogical:   No cervical lymphadenopathy. Cardiovascular:   RRR. Symmetric bilateral radial and DP pulses.  No murmurs.  Respiratory:   Normal respiratory effort without tachypnea/retractions. Breath sounds are clear and equal bilaterally. No wheezes/rales/rhonchi. Gastrointestinal:   Soft and nontender. Non distended. There is no CVA tenderness.  No rebound, rigidity, or guarding. Genitourinary:   Pelvic exam performed with nurse at bedside. External exam unremarkable. Speculum exam  reveals clotted blood in the vault, no significant active bleeding after clearing away the clots. IUD strings in place protruding from the cervical os. No cervical motion tenderness. No adnexal tenderness or masses. Uterus is not significantly enlarged on bimanual exam. Musculoskeletal:   Normal range of motion in all extremities. No joint effusions.  No lower extremity tenderness.  No edema. Neurologic:   Normal speech and language.  Motor grossly intact. No gross focal neurologic deficits are appreciated.  Skin:    Skin is warm, dry and intact. No rash noted.  No petechiae, purpura, or bullae.  ____________________________________________    LABS (pertinent positives/negatives) (all labs ordered are listed, but only abnormal results are displayed) Labs Reviewed - No data to display ____________________________________________   EKG    ____________________________________________    RADIOLOGY  No results found.  ____________________________________________   PROCEDURES Procedures  ____________________________________________   INITIAL IMPRESSION / ASSESSMENT AND PLAN / ED COURSE  Pertinent labs & imaging results that were available during my care of the patient were reviewed by me and considered in my medical decision making (see chart for details).  Patient presents with vaginal bleeding after IUD insertion, 6 weeks postpartum. Bleeding appears to be slowing or nearly stopped. Vital signs are stable. No worrisome symptoms. Clinically she is not having any significant hemorrhage and no evidence of shock. Counseled to continue monitoring and expecting improvement. If not resolved by Monday in 2 days she should call her doctor.      ____________________________________________   FINAL CLINICAL IMPRESSION(S) / ED DIAGNOSES  Final diagnoses:  Abnormal uterine bleeding (AUB)      New Prescriptions   No medications on file     Portions of this note were  generated with dragon dictation software. Dictation errors may occur despite best attempts at proofreading.    Sharman Cheek, MD 02/28/17 [phone]

## 2017-02-28 NOTE — ED Notes (Signed)

## 2017-06-25 IMAGING — US US MFM OB LIMITED
1 series · 13 of 28 positions shown · non-contrast
Comparison: none

PATIENT INFO:

LINDA
PERFORMED BY:
DOEE PA
SERVICE(S) PROVIDED:
US MFM OB LIMITED                                     76815.01
INDICATIONS:
14 weeks gestation of pregnancy
First trimester screen
FETAL EVALUATION:
Num Of Fetuses:     1
Fetal Heart         160
Rate(bpm):
Cardiac Activity:   Present
Presentation:       Vertex
Placenta:           Anterior
Amniotic Fluid
AFI FV:      Within normal limits
BIOMETRY:
CRL:      82.7  mm     G. Age:  14w 1d                  EDD:    01/14/17
GESTATIONAL AGE:
LMP:           13w 0d        Date:  04/17/16                 EDD:   01/22/17
Best:          14w 3d     Det. By:  Early Ultrasound         EDD:   01/12/17
(06/23/16)
1ST TRIMESTER GENETIC SONOGRAM SCREENING:
CRL:              83  mm    G. Age:   14w 1d                 EDD:   01/14/17
Nuc Trans:       1.4  mm
ANATOMY:
Choroid Plexus:        Within normal limits   Bladder:                Seen
for gestational ag
Stomach:               Seen                   Upper Extremities:      Visualized
Abdominal Wall:        Within normal limits   Lower Extremities:      Visualized
for gestational age

[Series 1: us mfm ob limited · 0.26mm/px · 13 of 61 slices shown]
[im 3/61]
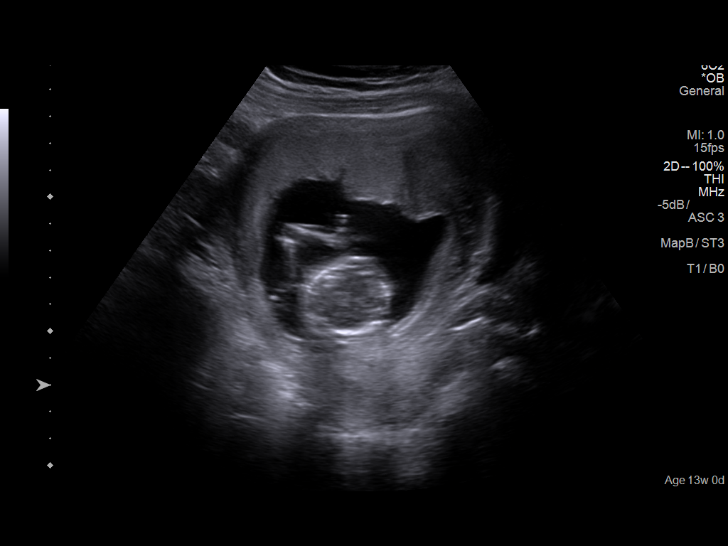
[im 7/61]
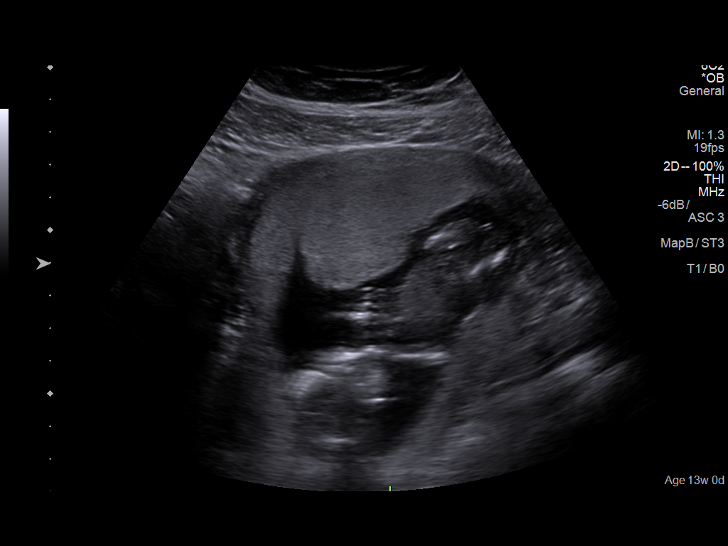
[im 12/61]
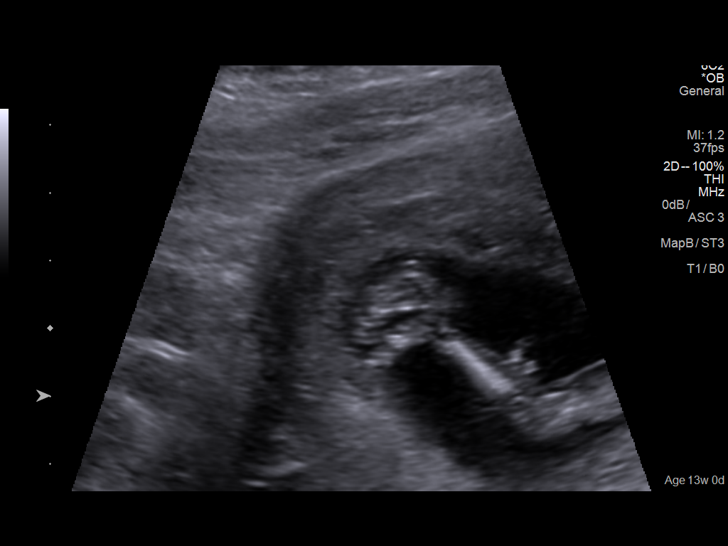
[im 16/61]
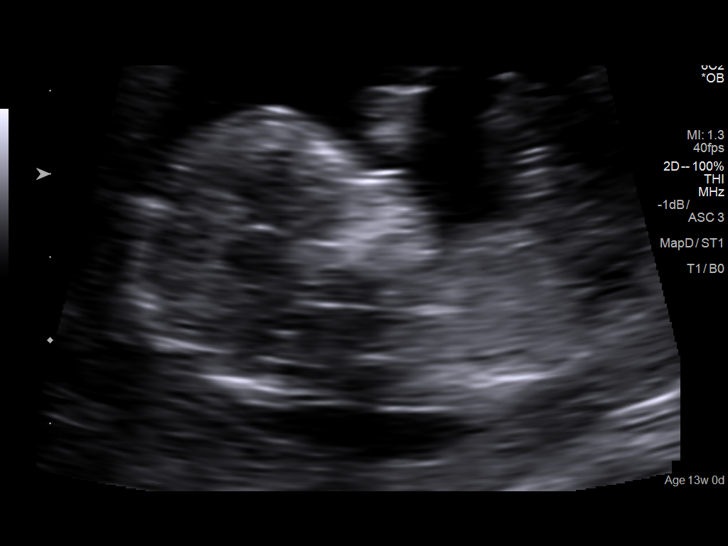
[im 21/61]
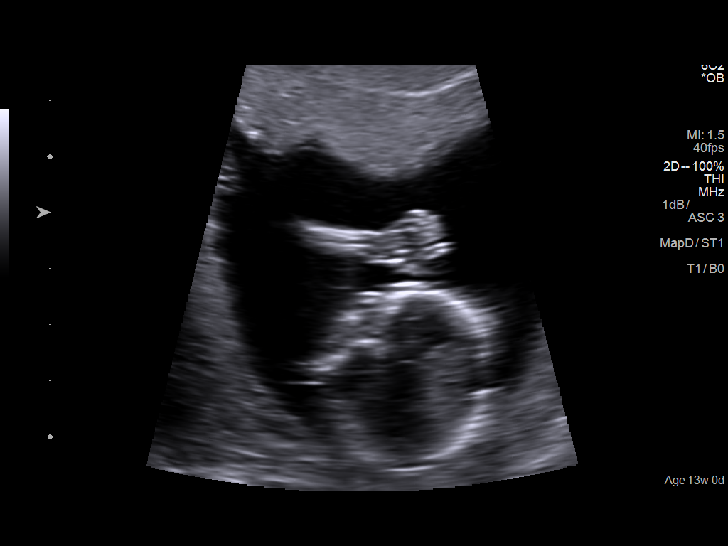
[im 25/61]
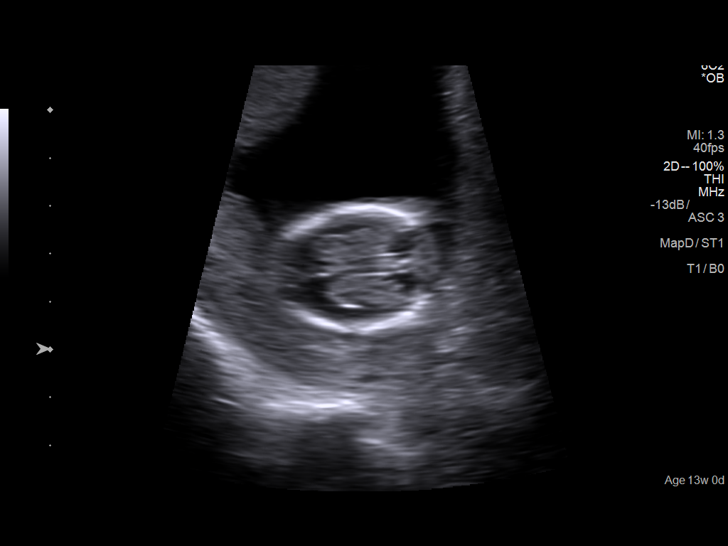
[im 32/61]
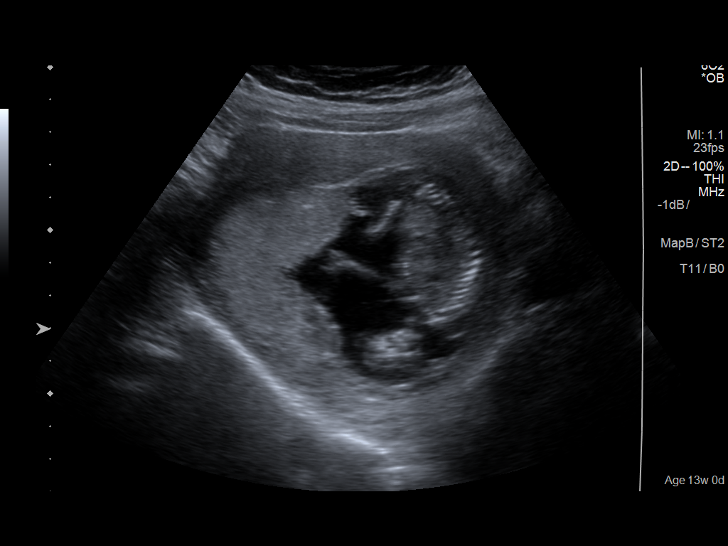
[im 36/61]
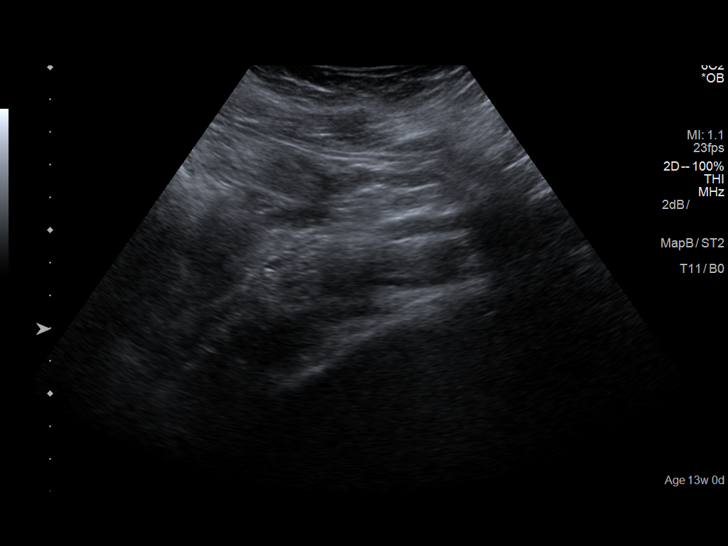
[im 41/61]
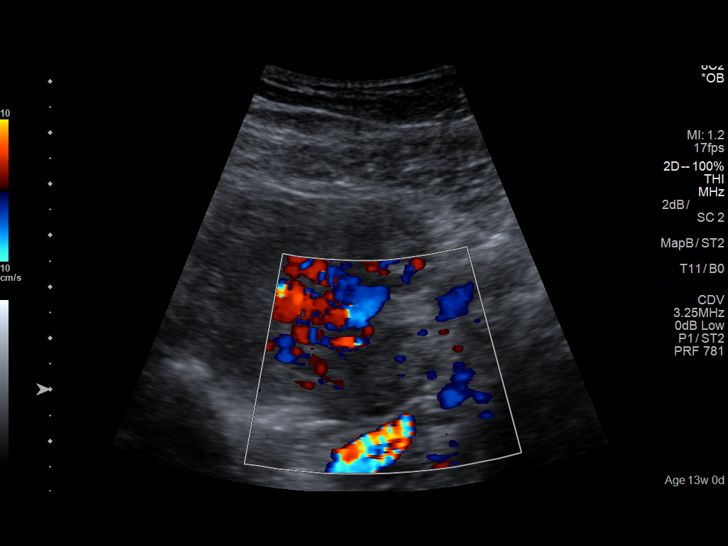
[im 45/61]
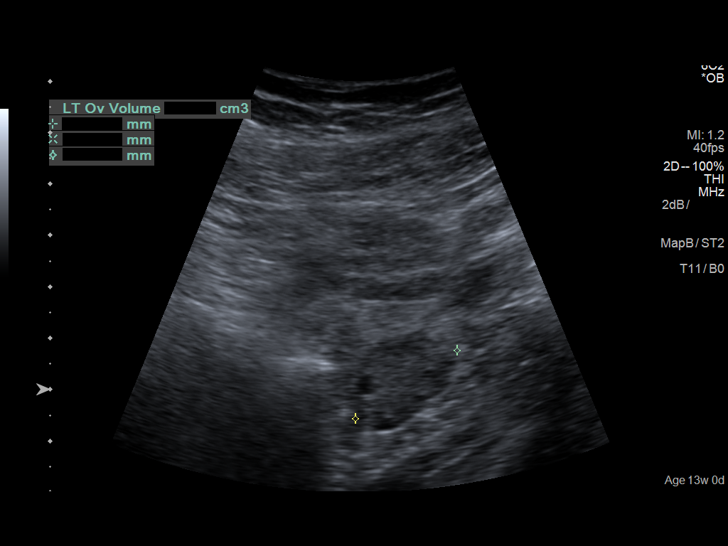
[im 49/61]
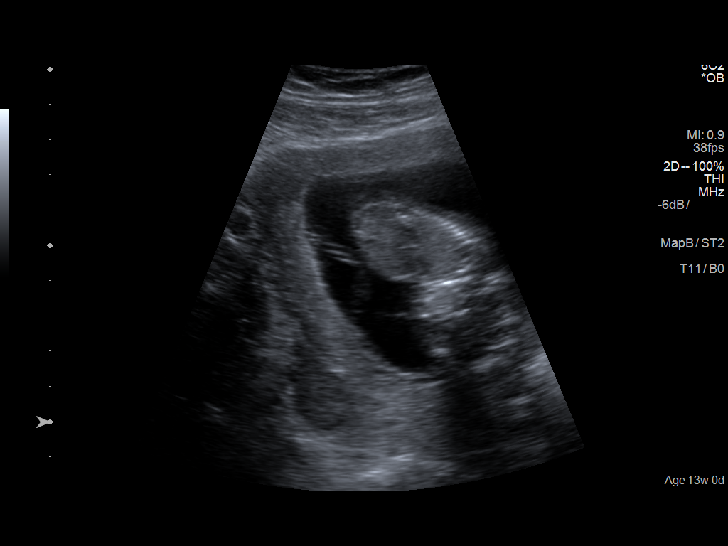
[im 54/61]
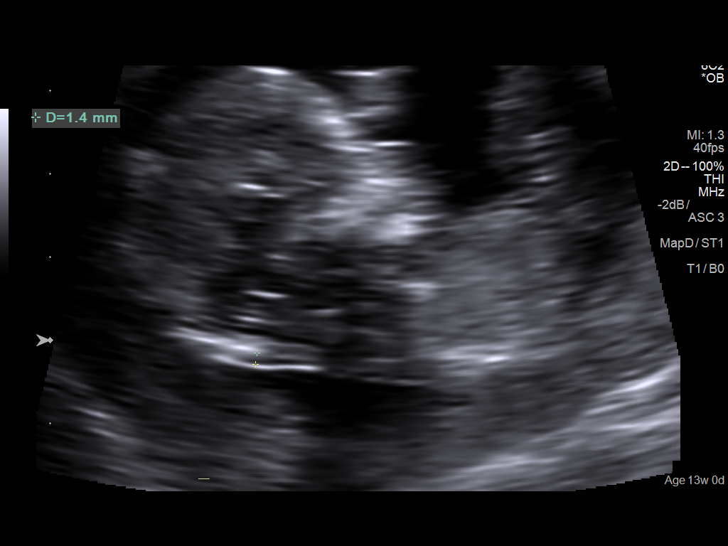
[im 58/61]
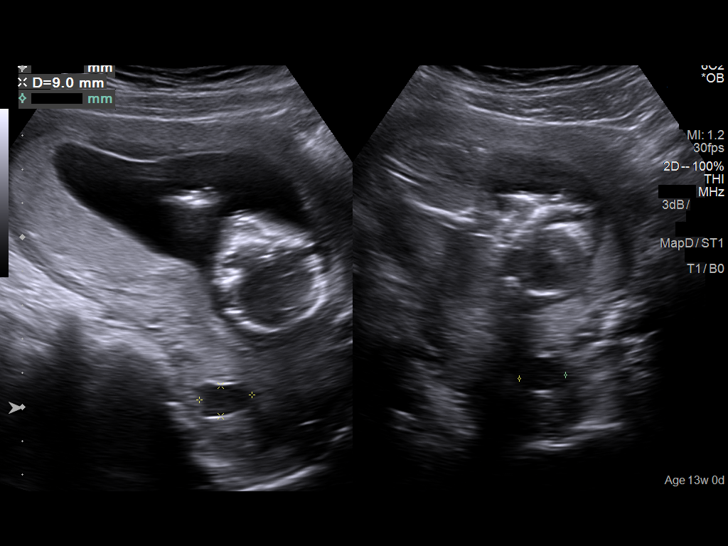

[13 of 28 positions shown; findings below may reference images not displayed]

IMPRESSION: Ms. Nc presents for genetic counseling and first
trimester screen.

Ultrasound demonstrates a single live pregnancy at 14 [DATE]
weeks.  Dating is by earliest available ultraosund performed
at [HOSPITAL] on 06/23/16 with measurements of 11 0/7 weeks.
Visualization of fetal anatomy is limited by early gestational
age. The best nuchal translucency is 1.4 mm.  The amniotic
fluid volume was normal.  Both ovaries were seen and appear
normal.

Ms. Nc completed genetic counseling and elected for
first trimester screening. Serum analytes were sent to
complete the study.  Return in 3-4 weeks for anatomy scan.

## 2018-09-25 ENCOUNTER — Other Ambulatory Visit: Payer: Self-pay

## 2018-09-25 ENCOUNTER — Encounter: Payer: Self-pay | Admitting: Emergency Medicine

## 2018-09-25 ENCOUNTER — Emergency Department
Admission: EM | Admit: 2018-09-25 | Discharge: 2018-09-25 | Disposition: A | Discharge status: Home / Self Care | Payer: Self-pay | Attending: Emergency Medicine | Admitting: Emergency Medicine

## 2018-09-25 DIAGNOSIS — Z79899 Other long term (current) drug therapy: Secondary | ICD-10-CM | POA: Insufficient documentation

## 2018-09-25 DIAGNOSIS — Z87891 Personal history of nicotine dependence: Secondary | ICD-10-CM | POA: Insufficient documentation

## 2018-09-25 DIAGNOSIS — I10 Essential (primary) hypertension: Secondary | ICD-10-CM | POA: Insufficient documentation

## 2018-09-25 DIAGNOSIS — R05 Cough: Secondary | ICD-10-CM | POA: Insufficient documentation

## 2018-09-25 DIAGNOSIS — Z8673 Personal history of transient ischemic attack (TIA), and cerebral infarction without residual deficits: Secondary | ICD-10-CM | POA: Insufficient documentation

## 2018-09-25 DIAGNOSIS — J029 Acute pharyngitis, unspecified: Secondary | ICD-10-CM | POA: Insufficient documentation

## 2018-09-25 LAB — INFLUENZA PANEL BY PCR (TYPE A & B)
Influenza A By PCR: NEGATIVE
Influenza B By PCR: NEGATIVE

## 2018-09-25 LAB — GROUP A STREP BY PCR: Group A Strep by PCR: NOT DETECTED

## 2018-09-25 LAB — MONONUCLEOSIS SCREEN: Mono Screen: NEGATIVE

## 2018-09-25 MED ORDER — AMOXICILLIN 500 MG PO CAPS: 500.0000 mg | ORAL_CAPSULE | Freq: Three times a day (TID) | ORAL | 0 refills | Status: DC

## 2018-09-25 NOTE — ED Triage Notes (Signed)
Sore throat, cough and body aches x 2 days.

## 2018-09-25 NOTE — ED Provider Notes (Signed)
[facility] Emergency Department Provider Note  ____________________________________________   First MD Initiated Contact with Patient 09/25/18 1343     (approximate)  I have reviewed the triage vital signs and the nursing notes.   HISTORY  Chief Complaint Sore Throat and Cough    HPI Cheryl Hines is a 33 y.o. female presents to the emergency department with flulike symptoms, patient is complained of fever, chills, body aches. cough, sore throat, denies vomiting, denies diarrhea; denies chest pain or sob.  Sx for 2 days   Past Medical History:  Diagnosis Date  . Anemia   . Hypertension   . Stroke (HCC)   . Vertigo     Patient Active Problem List   Diagnosis Date Noted  . Postpartum care following vaginal delivery 01/13/2017  . Major depression 12/17/2016  . History of posttraumatic stress disorder (PTSD) 12/17/2016  . BMI 34.0-34.9,adult 12/17/2016  . Family history of sickle cell trait 07/17/2016    History reviewed. No pertinent surgical history.  Prior to Admission medications   Medication Sig Start Date End Date Taking? Authorizing Provider  amoxicillin (AMOXIL) 500 MG capsule Take 1 capsule (500 mg total) by mouth 3 (three) times daily. 09/25/18   Jeremiah Curci, Roselyn BeringSusan W, PA-C  docusate sodium (COLACE) 100 MG capsule Take 1 capsule (100 mg total) by mouth 2 (two) times daily. Patient not taking: Reported on 02/28/2017 09/03/16   Sharman CheekStafford, Phillip, MD  Prenatal Vit-Fe Fumarate-FA [facility](MULTI PRENATAL PO) Take by mouth.    [provider]  ranitidine (ZANTAC) 150 MG capsule Take 150 mg by mouth 2 (two) times daily.    [provider]  sertraline (ZOLOFT) 50 MG tablet Take 50 mg by mouth daily.    [provider]    Allergies Patient has no known allergies.  No family history on file.  Social History Social History   Tobacco Use  . Smoking status: Former Smoker    Packs/day: 0.50    Types: Cigarettes   Last attempt to quit: 04/13/2016    Years since quitting: 2.4  . Smokeless tobacco: Never Used  Substance Use Topics  . Alcohol use: No  . Drug use: No    Review of Systems  Constitutional: Positive fever/chills Eyes: No visual changes. ENT: Positive sore throat. Respiratory: Mild cough Genitourinary: Negative for dysuria. Musculoskeletal: Negative for back pain. Skin: Negative for rash.    ____________________________________________   PHYSICAL EXAM:  VITAL SIGNS: ED Triage Vitals  Enc Vitals Group     BP 09/25/18 1323 (!) 141/97     Pulse Rate 09/25/18 1323 (!) 104     Resp 09/25/18 1323 20     Temp 09/25/18 1323 99.6 F (37.6 C)     Temp Source 09/25/18 1323 Oral     SpO2 09/25/18 1323 98 %     Weight 09/25/18 1324 187 lb (84.8 kg)     Height 09/25/18 1324 5\' 6"  (1.676 m)     Head Circumference --      Peak Flow --      Pain Score 09/25/18 1323 10     Pain Loc --      Pain Edu? --      Excl. in GC? --     Constitutional: Alert and oriented. Well appearing and in no acute distress. Eyes: Conjunctivae are normal.  Head: Atraumatic. Nose: No congestion/rhinnorhea. Mouth/Throat: Mucous membranes are moist.  Throat is red with exudates posteriorly Neck:  supple no lymphadenopathy noted Cardiovascular: Normal  rate, regular rhythm. Heart sounds are normal Respiratory: Normal respiratory effort.  No retractions, lungs c t a  GU: deferred Musculoskeletal: FROM all extremities, warm and well perfused Neurologic:  Normal speech and language.  Skin:  Skin is warm, dry and intact. No rash noted. Psychiatric: Mood and affect are normal. Speech and behavior are normal.  ____________________________________________   LABS (all labs ordered are listed, but only abnormal results are displayed)  Labs Reviewed  GROUP A STREP BY PCR  INFLUENZA PANEL BY PCR (TYPE A & B)  MONONUCLEOSIS SCREEN    ____________________________________________   ____________________________________________  RADIOLOGY    ____________________________________________   PROCEDURES  Procedure(s) performed: No  Procedures    ____________________________________________   INITIAL IMPRESSION / ASSESSMENT AND PLAN / ED COURSE  Pertinent labs & imaging results that were available during my care of the patient were reviewed by me and considered in my medical decision making (see chart for details).   Patient is a 33 year old female presents emergency department sore throat and body aches along with flulike symptoms.  Physical exam shows a red throat with exudates.  Remainder the exam is unremarkable.  Strep test is negative, mono test is negative, and influenza test is negative  Explained test results to the patient.  Due to the amount of exudate she was placed on amoxicillin.  She is to follow-up with her regular doctor if not better in 3 days.  Return emergency department worsening.  She states she understands will comply.  She was discharged in stable condition.     As part of my medical decision making, I reviewed the following data within the electronic MEDICAL RECORD NUMBER Nursing notes reviewed and incorporated, Labs reviewed flu, strep, and mono test are negative, Notes from prior ED visits and [place] Controlled Substance Database  ____________________________________________   FINAL CLINICAL IMPRESSION(S) / ED DIAGNOSES  Final diagnoses:  Acute pharyngitis, unspecified etiology      NEW MEDICATIONS STARTED DURING THIS VISIT:  Discharge Medication List as of 09/25/2018  3:05 PM    START taking these medications   Details  amoxicillin (AMOXIL) 500 MG capsule Take 1 capsule (500 mg total) by mouth 3 (three) times daily., Starting Sat 09/25/2018, Normal         Note:  This document was prepared using Dragon voice recognition software and may include unintentional dictation  errors.    Faythe GheeFisher, Khyle Goodell W, PA-C 09/25/18 1719    Willy Eddyobinson, Patrick, MD 09/25/18 [id number] [id number]

## 2018-09-25 NOTE — Discharge Instructions (Addendum)
Follow-up with regular doctor if not better in 3 days.  Return emergency department worsening.  Take Tylenol and ibuprofen for pain and fever.  Gargle with warm salt water.  Discard her toothbrush in 3 days.

## 2019-02-02 ENCOUNTER — Emergency Department
Admission: EM | Admit: 2019-02-02 | Discharge: 2019-02-02 | Disposition: A | Discharge status: Home / Self Care | Payer: HRSA Program | Attending: Emergency Medicine | Admitting: Emergency Medicine

## 2019-02-02 ENCOUNTER — Other Ambulatory Visit: Payer: Self-pay

## 2019-02-02 ENCOUNTER — Emergency Department: Payer: HRSA Program

## 2019-02-02 ENCOUNTER — Encounter: Payer: Self-pay | Admitting: Emergency Medicine

## 2019-02-02 DIAGNOSIS — I10 Essential (primary) hypertension: Secondary | ICD-10-CM | POA: Diagnosis not present

## 2019-02-02 DIAGNOSIS — J069 Acute upper respiratory infection, unspecified: Secondary | ICD-10-CM

## 2019-02-02 DIAGNOSIS — Z87891 Personal history of nicotine dependence: Secondary | ICD-10-CM | POA: Insufficient documentation

## 2019-02-02 DIAGNOSIS — J029 Acute pharyngitis, unspecified: Secondary | ICD-10-CM | POA: Insufficient documentation

## 2019-02-02 DIAGNOSIS — Z7189 Other specified counseling: Secondary | ICD-10-CM

## 2019-02-02 DIAGNOSIS — Z20828 Contact with and (suspected) exposure to other viral communicable diseases: Secondary | ICD-10-CM | POA: Diagnosis not present

## 2019-02-02 DIAGNOSIS — R05 Cough: Secondary | ICD-10-CM | POA: Diagnosis present

## 2019-02-02 DIAGNOSIS — Z79899 Other long term (current) drug therapy: Secondary | ICD-10-CM | POA: Diagnosis not present

## 2019-02-02 MED ORDER — KETOROLAC TROMETHAMINE 10 MG PO TABS
10.0000 mg | ORAL_TABLET | Freq: Once | ORAL | Status: AC
  Administered 2019-02-02: 17:00:00 10 mg via ORAL
  Filled 2019-02-02 (×2): qty 1

## 2019-02-02 MED ORDER — LIDOCAINE VISCOUS HCL 2 % MT SOLN: 20.0000 mL | OROMUCOSAL | 0 refills | Status: AC | PRN

## 2019-02-02 NOTE — ED Provider Notes (Addendum)
[facility] Emergency Department Provider Note  ____________________________________________  Time seen: Approximately 4:28 PM  I have reviewed the triage vital signs and the nursing notes.   HISTORY  Chief Complaint Cough    HPI Cheryl Hines is a 33 y.o. female with a history of anemia and hypertension who comes the ED complaining of nonproductive cough, sore throat, body aches, chills and sweats that are worse at night for the past 3 days.  Symptoms are waxing and waning, no aggravating or alleviating factors.  Also feels very fatigued.  No known sick contacts but she works as an International aid/development worker at [employer] and has frequent contact with the [employer].  Patient denies any significant medical history, no diabetes or heart disease or asthma.      Past Medical History:  Diagnosis Date  . Anemia   . Hypertension   . Stroke (HCC)   . Vertigo      Patient Active Problem List   Diagnosis Date Noted  . Postpartum care following vaginal delivery 01/13/2017  . Major depression 12/17/2016  . History of posttraumatic stress disorder (PTSD) 12/17/2016  . BMI 34.0-34.9,adult 12/17/2016  . Family history of sickle cell trait 07/17/2016     History reviewed. No pertinent surgical history.   Prior to Admission medications   Medication Sig Start Date End Date Taking? Authorizing Provider  amoxicillin (AMOXIL) 500 MG capsule Take 1 capsule (500 mg total) by mouth 3 (three) times daily. Patient not taking: Reported on 02/02/2019 09/25/18   Faythe Ghee, PA-C  docusate sodium (COLACE) 100 MG capsule Take 1 capsule (100 mg total) by mouth 2 (two) times daily. Patient not taking: Reported on 02/28/2017 09/03/16   Sharman Cheek, MD  lidocaine (XYLOCAINE) 2 % solution Use as directed 20 mLs in the mouth or throat every 2 (two) hours as needed for mouth pain. Gargle and spit out 02/02/19   Sharman Cheek, MD  Prenatal Vit-Fe  Fumarate-FA [facility] PRENATAL PO) Take by mouth.    [provider]  ranitidine (ZANTAC) 150 MG capsule Take 150 mg by mouth 2 (two) times daily.    [provider]  sertraline (ZOLOFT) 50 MG tablet Take 50 mg by mouth daily.    [provider]     Allergies Patient has no known allergies.   No family history on file.  Social History Social History   Tobacco Use  . Smoking status: Former Smoker    Packs/day: 0.50    Types: Cigarettes    Last attempt to quit: 04/13/2016    Years since quitting: 2.8  . Smokeless tobacco: Never Used  Substance Use Topics  . Alcohol use: No  . Drug use: No    Review of Systems  Constitutional: Positive fever and chills.  ENT:   Positive sore throat. No rhinorrhea. Cardiovascular:   No chest pain or syncope. Respiratory:   Positive shortness of breath and nonproductive cough. Gastrointestinal:   Negative for abdominal pain, vomiting and diarrhea.  Musculoskeletal:   Negative for focal pain or swelling.  Positive generalized body aches All other systems reviewed and are negative except as documented above in ROS and HPI.  ____________________________________________   PHYSICAL EXAM:  VITAL SIGNS: ED Triage Vitals  Enc Vitals Group     BP 02/02/19 1430 128/87     Pulse Rate 02/02/19 1430 86     Resp 02/02/19 1430 20     Temp 02/02/19 1430 98.1 F (36.7 C)  Temp Source 02/02/19 1430 Oral     SpO2 02/02/19 1430 98 %     Weight 02/02/19 1431 160 lb (72.6 kg)     Height 02/02/19 1431 5\' 7"  (1.702 m)     Head Circumference --      Peak Flow --      Pain Score 02/02/19 1430 8     Pain Loc --      Pain Edu? --      Excl. in GC? --     Vital signs reviewed, nursing assessments reviewed.   Constitutional:   Alert and oriented. Non-toxic appearance. Eyes:   Conjunctivae are normal. EOMI. PERRL. ENT      Head:   Normocephalic and atraumatic.      Nose:   No congestion/rhinnorhea.       Mouth/Throat:    MMM, bright pharyngeal erythema. No peritonsillar mass.       Neck:   No meningismus. Full ROM. Hematological/Lymphatic/Immunilogical:   No cervical lymphadenopathy. Cardiovascular:   RRR. Symmetric bilateral radial and DP pulses.  No murmurs. Cap refill less than 2 seconds. Respiratory:   Normal respiratory effort without tachypnea/retractions. Breath sounds are clear and equal bilaterally. No wheezes/rales/rhonchi. Gastrointestinal:   Soft and nontender. Non distended. There is no CVA tenderness.  No rebound, rigidity, or guarding.  Musculoskeletal:   Normal range of motion in all extremities. No joint effusions.  No lower extremity tenderness.  No edema. Neurologic:   Normal speech and language.  Motor grossly intact. No acute focal neurologic deficits are appreciated.  Skin:    Skin is warm, dry and intact. No rash noted.  No petechiae, purpura, or bullae.  ____________________________________________    LABS (pertinent positives/negatives) (all labs ordered are listed, but only abnormal results are displayed) Labs Reviewed  NOVEL CORONAVIRUS, NAA (HOSPITAL ORDER, SEND-OUT TO REF LAB)   ____________________________________________   EKG    ____________________________________________    RADIOLOGY  Dg Chest Portable 1 View  Result Date: 02/02/2019 CLINICAL DATA:  Cough and sore throat for a few days. EXAM: PORTABLE CHEST 1 VIEW COMPARISON:  04/06/2016 FINDINGS: The cardiomediastinal silhouette is within normal limits. No airspace consolidation, edema, pleural effusion, pneumothorax is identified. No acute osseous abnormality is seen. IMPRESSION: No active disease. Electronically Signed   By: Sebastian AcheAllen  Grady M.D.   On: 02/02/2019 15:00    ____________________________________________   PROCEDURES Procedures  ____________________________________________    CLINICAL IMPRESSION / ASSESSMENT AND PLAN / ED COURSE  Medications ordered in the ED: Medications  ketorolac  (TORADOL) tablet 10 mg (has no administration in time range)    Pertinent labs & imaging results that were available during my care of the patient were reviewed by me and considered in my medical decision making (see chart for details).  [name] was evaluated in Emergency Department on 02/02/2019 for the symptoms described in the history of present illness. She was evaluated in the context of the global COVID-19 pandemic, which necessitated consideration that the patient might be at risk for infection with the SARS-CoV-2 virus that causes COVID-19. Institutional protocols and algorithms that pertain to the evaluation of patients at risk for COVID-19 are in a state of rapid change based on information released by regulatory bodies including the CDC and federal and state organizations. These policies and algorithms were followed during the patient's care in the ED.   Patient presents with symptoms of viral respiratory infection, viral pharyngitis and viral syndrome.  She is nontoxic and vital signs are normal.  Exam is benign except for evidence of pharyngitis.  Chest x-ray is unremarkable.  Counseled her at length about the possibility of this being novel coronavirus infection and recommended home quarantine and monitoring her family members symptoms including her 83-year-old and 32 year old.  Work note provided for her to stay home from work at least until her coronavirus result is available.  Recommended cough drops, viscous lidocaine, NSAIDs for symptom relief at home.  Counseled on hydration and nutrition.  Given the constellation of symptoms and normal vital signs I highly doubt ACS PE dissection AAA pneumothorax pericarditis pneumonia or sepsis.  return precautions discussed.      ____________________________________________   FINAL CLINICAL IMPRESSION(S) / ED DIAGNOSES    Final diagnoses:  Viral URI with cough  Pharyngitis, unspecified etiology  Advice Given About  Covid-19 Virus Infection     ED Discharge Orders         Ordered    lidocaine (XYLOCAINE) 2 % solution  Every 2 hours PRN     02/02/19 1628          Portions of this note were generated with dragon dictation software. Dictation errors may occur despite best attempts at proofreading.   Sharman Cheek, MD 02/02/19 8119    Sharman Cheek, MD 02/02/19 [phone]

## 2019-02-02 NOTE — ED Triage Notes (Signed)
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

## 2019-02-02 NOTE — Discharge Instructions (Signed)
You should remain at home under self-quarantine for two weeks unless 1) Your coronavirus test that we sent today comes back negative, and 2) You feel that your symptoms have been resolved for 48 hours.

## 2019-02-03 LAB — NOVEL CORONAVIRUS, NAA (HOSP ORDER, SEND-OUT TO REF LAB; TAT 18-24 HRS): SARS-CoV-2, NAA: NOT DETECTED

## 2021-10-30 ENCOUNTER — Emergency Department: Payer: Medicaid Other

## 2021-10-30 ENCOUNTER — Other Ambulatory Visit: Payer: Self-pay

## 2021-10-30 ENCOUNTER — Encounter: Payer: Self-pay | Admitting: Emergency Medicine

## 2021-10-30 ENCOUNTER — Emergency Department
Admission: EM | Admit: 2021-10-30 | Discharge: 2021-10-30 | Disposition: A | Discharge status: Home / Self Care | Payer: Medicaid Other | Attending: Student in an Organized Health Care Education/Training Program | Admitting: Student in an Organized Health Care Education/Training Program

## 2021-10-30 DIAGNOSIS — Z20822 Contact with and (suspected) exposure to covid-19: Secondary | ICD-10-CM | POA: Insufficient documentation

## 2021-10-30 DIAGNOSIS — J069 Acute upper respiratory infection, unspecified: Secondary | ICD-10-CM | POA: Insufficient documentation

## 2021-10-30 LAB — RESP PANEL BY RT-PCR (FLU A&B, COVID) ARPGX2
Influenza A by PCR: NEGATIVE
Influenza B by PCR: NEGATIVE
SARS Coronavirus 2 by RT PCR: NEGATIVE

## 2021-10-30 MED ORDER — PSEUDOEPH-BROMPHEN-DM 30-2-10 MG/5ML PO SYRP: 5.0000 mL | ORAL_SOLUTION | Freq: Four times a day (QID) | ORAL | 0 refills | Status: AC | PRN

## 2021-10-30 MED ORDER — ALBUTEROL SULFATE HFA 108 (90 BASE) MCG/ACT IN AERS: 2.0000 | INHALATION_SPRAY | Freq: Four times a day (QID) | RESPIRATORY_TRACT | 0 refills | Status: AC | PRN

## 2021-10-30 MED ORDER — BENZONATATE 100 MG PO CAPS: ORAL_CAPSULE | ORAL | 0 refills | Status: AC

## 2021-10-30 NOTE — ED Triage Notes (Signed)
C/O productive cough, painful cough x 4 days.  AAOx3.  Skin warm and dry. NAD

## 2021-10-30 NOTE — ED Provider Notes (Signed)
[facility] Emergency Department Provider Note     Event Date/Time   First MD Initiated Contact with Patient 10/30/21 1024     (approximate)   History   Cough   HPI  Cheryl Hines is a 36 y.o. female she reports 4 days of symptoms, including Tmax of 102 F on Monday. She has been taking OTC cough and cold medicines for symptoms. She noted onset of sore throat pain yesterday. She notes deep, productive cough. She denies N/V, abdominal pain, or bowel changes.        Physical Exam   Triage Vital Signs: ED Triage Vitals  Enc Vitals Group     BP 10/30/21 1031 (!) 131/93     Pulse Rate 10/30/21 1031 75     Resp 10/30/21 1031 20     Temp 10/30/21 1031 97.9 F (36.6 C)     Temp Source 10/30/21 1031 Oral     SpO2 10/30/21 1031 100 %     Weight 10/30/21 1008 160 lb 0.9 oz (72.6 kg)     Height 10/30/21 1008 5\' 7"  (1.702 m)     Head Circumference --      Peak Flow --      Pain Score 10/30/21 1007 0     Pain Loc --      Pain Edu? --      Excl. in GC? --     Most recent vital signs: Vitals:   10/30/21 1031  BP: (!) 131/93  Pulse: 75  Resp: 20  Temp: 97.9 F (36.6 C)  SpO2: 100%    General Awake, no distress.  Head:  No acute traumatic findings Eyes:   PERRL. EOMI. Ears:  TMs clear Nose:  No congestion/rhinorrhea Mouth/Throat: Mucous membranes are moist. CV:  Good peripheral perfusion.  RESP:  Normal effort.  ABD:  No distention.    ED Results / Procedures / Treatments   Labs (all labs ordered are listed, but only abnormal results are displayed) Labs Reviewed  RESP PANEL BY RT-PCR (FLU A&B, COVID) [id number]     EKG  RADIOLOGY  I personally viewed and evaluated these images as part of my medical decision making, as well as reviewing the written report by the radiologist.  ED Provider Interpretation: no acute disease  DG Chest 2 View  Result Date: 10/30/2021 CLINICAL DATA:  Productive, painful cough.  Mid chest  pain. EXAM: CHEST - 2 VIEW COMPARISON:  02/02/2019. FINDINGS: Trachea is midline. Heart size stable. Lungs are clear. No pleural fluid. IMPRESSION: No acute findings. Electronically Signed   By: 02/04/2019 M.D.   On: 10/30/2021 11:07     PROCEDURES:  Critical Care performed: No  Procedures   MEDICATIONS ORDERED IN ED: Medications - No data to display   IMPRESSION / MDM / ASSESSMENT AND PLAN / ED COURSE  I reviewed the triage vital signs and the nursing notes.                              Differential diagnosis includes, but is not limited to, viral URI, bronchitis, CAP, COVID, influenza  Patient ED evaluation of several days of cough, congestion, and intermittent fevers.  She is evaluated for complaints in the ED,, but overall reassuring exam.  Patient presents in no acute distress, afebrile, without tachycardia, hypoxia, or toxic appearance.  Chest x-ray is negative for any acute infectious process based on my review of images.  Viral panel screen is negative for COVID flu.  Patient's diagnosis is consistent with viral URI with cough. Patient will be discharged home with prescriptions for Tessalon Perles, Bromfed syrup, and albuterol inhaler. Patient is to follow up with her primary provider as needed or otherwise directed. Patient is given ED precautions to return to the ED for any worsening or new symptoms.  Work note is provided as requested.    FINAL CLINICAL IMPRESSION(S) / ED DIAGNOSES   Final diagnoses:  Viral URI with cough     Rx / DC Orders   ED Discharge Orders          Ordered    benzonatate (TESSALON PERLES) 100 MG capsule        10/30/21 1128    albuterol (VENTOLIN HFA) 108 (90 Base) MCG/ACT inhaler  Every 6 hours PRN        10/30/21 1128    brompheniramine-pseudoephedrine-DM 30-2-10 MG/5ML syrup  4 times daily PRN        10/30/21 1128             Note:  This document was prepared using Dragon voice recognition software and may include  unintentional dictation errors.    Lissa Hoard, PA-C 10/30/21 1147    Willy Eddy, MD 10/30/21 [phone]

## 2021-10-30 NOTE — Discharge Instructions (Addendum)
Your viral tests are negative and chest x-ray is reassuring.  Take prescription medicines as directed.  Follow-up with primary provider return to the ED if needed.

## 2022-01-16 ENCOUNTER — Other Ambulatory Visit: Payer: Self-pay

## 2022-01-16 ENCOUNTER — Encounter: Payer: Self-pay | Admitting: Emergency Medicine

## 2022-01-16 ENCOUNTER — Emergency Department
Admission: EM | Admit: 2022-01-16 | Discharge: 2022-01-16 | Disposition: A | Discharge status: Home / Self Care | Payer: PRIVATE HEALTH INSURANCE | Attending: Emergency Medicine | Admitting: Emergency Medicine

## 2022-01-16 DIAGNOSIS — I10 Essential (primary) hypertension: Secondary | ICD-10-CM | POA: Insufficient documentation

## 2022-01-16 DIAGNOSIS — R197 Diarrhea, unspecified: Secondary | ICD-10-CM | POA: Insufficient documentation

## 2022-01-16 DIAGNOSIS — R112 Nausea with vomiting, unspecified: Secondary | ICD-10-CM | POA: Insufficient documentation

## 2022-01-16 LAB — COMPREHENSIVE METABOLIC PANEL
ALT: 13 U/L (ref 0–44)
AST: 17 U/L (ref 15–41)
Albumin: 4 g/dL (ref 3.5–5.0)
Alkaline Phosphatase: 51 U/L (ref 38–126)
Anion gap: 5 (ref 5–15)
BUN: 8 mg/dL (ref 6–20)
CO2: 24 mmol/L (ref 22–32)
Calcium: 8.7 mg/dL — ABNORMAL LOW (ref 8.9–10.3)
Chloride: 107 mmol/L (ref 98–111)
Creatinine, Ser: 0.69 mg/dL (ref 0.44–1.00)
GFR, Estimated: >60 mL/min (ref >60)
Glucose, Bld: 104 mg/dL — ABNORMAL HIGH (ref 70–99)
Potassium: 3.6 mmol/L (ref 3.5–5.1)
Sodium: 136 mmol/L (ref 135–145)
Total Bilirubin: 0.4 mg/dL (ref 0.3–1.2)
Total Protein: 7.6 g/dL (ref 6.5–8.1)

## 2022-01-16 LAB — CBC
HCT: 40 % (ref 36.0–46.0)
Hemoglobin: 12.8 g/dL (ref 12.0–15.0)
MCH: 26.9 pg (ref 26.0–34.0)
MCHC: 32 g/dL (ref 30.0–36.0)
MCV: 84.2 fL (ref 80.0–100.0)
Platelets: 170 10*3/uL (ref 150–400)
RBC: 4.75 MIL/uL (ref 3.87–5.11)
RDW: 13.7 % (ref 11.5–15.5)
WBC: 4.6 10*3/uL (ref 4.0–10.5)
nRBC: 0 % (ref 0.0–0.2)

## 2022-01-16 LAB — LIPASE, BLOOD: Lipase: 29 U/L (ref 11–51)

## 2022-01-16 MED ORDER — ONDANSETRON HCL 4 MG/2ML IJ SOLN
4.0000 mg | Freq: Once | INTRAMUSCULAR | Status: AC
  Administered 2022-01-16: 4 mg via INTRAVENOUS
  Filled 2022-01-16: qty 2

## 2022-01-16 MED ORDER — SODIUM CHLORIDE 0.9 % IV BOLUS
1000.0000 mL | Freq: Once | INTRAVENOUS | Status: AC
  Administered 2022-01-16: 1000 mL via INTRAVENOUS

## 2022-01-16 MED ORDER — ONDANSETRON 4 MG PO TBDP: 4.0000 mg | ORAL_TABLET | Freq: Three times a day (TID) | ORAL | 0 refills | Status: AC | PRN

## 2022-01-16 NOTE — Discharge Instructions (Signed)
Follow-up with your regular doctor if not improving to 3 days.  Use Zofran ODT for nausea as needed.  Take Imodium A-D for diarrhea if continues.  Drink plenty of fluids ?

## 2022-01-16 NOTE — ED Triage Notes (Signed)
Pt comes into the ED via POV c/o N/V/D since Tuesday.  Pt denies any abd pain with it other than normal cramping when the episodes are occurring.  Pt ambulatory to triage at this time with even and unlabored respirations.   ?

## 2022-01-16 NOTE — ED Provider Notes (Signed)
? ?[facility] ?Provider Note ? ? ? Event Date/Time  ? First MD Initiated Contact with Patient 01/16/22 1301   ?  (approximate) ? ? ?History  ? ?Emesis and Diarrhea ? ? ?HPI ? ?Cheryl Hines is a 36 y.o. female with history of stroke, anemia, hypertension and vertigo presents emergency department with nausea and vomiting along with diarrhea for 2 days.  Patient states that she has had no fever.  States she has multiple episodes and she cannot eat.  Started to feel dehydrated.  No dysuria.  No back pain. ? ?  ? ? ?Physical Exam  ? ?Triage Vital Signs: ?ED Triage Vitals  ?Enc Vitals Group  ?   BP 01/16/22 1218 133/90  ?   Pulse Rate 01/16/22 1218 77  ?   Resp 01/16/22 1218 16  ?   Temp 01/16/22 1218 99 ?F (37.2 ?C)  ?   Temp Source 01/16/22 1218 Oral  ?   SpO2 01/16/22 1218 93 %  ?   Weight 01/16/22 1224 160 lb 0.9 oz (72.6 kg)  ?   Height 01/16/22 1224 5\' 7"  (1.702 m)  ?   Head Circumference --   ?   Peak Flow --   ?   Pain Score 01/16/22 1224 2  ?   Pain Loc --   ?   Pain Edu? --   ?   Excl. in Minor? --   ? ? ?Most recent vital signs: ?Vitals:  ? 01/16/22 1218  ?BP: 133/90  ?Pulse: 77  ?Resp: 16  ?Temp: 99 ?F (37.2 ?C)  ?SpO2: 93%  ? ? ? ?General: Awake, no distress.   ?CV:  Good peripheral perfusion. regular rate and  rhythm ?Resp:  Normal effort. Lungs cta ?Abd:  No distention.  Nontender ?Other:    ? ? ?ED Results / Procedures / Treatments  ? ?Labs ?(all labs ordered are listed, but only abnormal results are displayed) ?Labs Reviewed  ?COMPREHENSIVE METABOLIC PANEL - Abnormal; Notable for the following components:  ?    Result Value  ? Glucose, Bld 104 (*)   ? Calcium 8.7 (*)   ? All other components within normal limits  ?LIPASE, BLOOD  ?CBC  ?URINALYSIS, ROUTINE W REFLEX MICROSCOPIC  ?POC URINE PREG, ED  ? ? ? ?EKG ? ? ? ? ?RADIOLOGY ? ? ? ? ?PROCEDURES: ? ? ?Procedures ? ? ?MEDICATIONS ORDERED IN ED: ?Medications  ?sodium chloride 0.9 % bolus 1,000 mL (1,000 mLs Intravenous  New Bag/Given 01/16/22 1334)  ?ondansetron (ZOFRAN) injection 4 mg (4 mg Intravenous Given 01/16/22 1335)  ? ? ? ?IMPRESSION / MDM / ASSESSMENT AND PLAN / ED COURSE  ?I reviewed the triage vital signs and the nursing notes. ?             ?               ? ?Differential diagnosis includes, but is not limited to, acute gastroenteritis, pregnancy, food poisoning ? ?Due to the community endemic nausea/vomiting viral illness, I do feel this is more of a virus.  Patient will be given normal saline 1 L IV along with some Zofran.  We will also check labs and urine.  Abdomen is nontender so I doubt that the patient will need any imaging. ? ?Patient's labs are reassuring, CBC, metabolic panel and lipase are all normal. ? ?Patient states she is feeling little better.  She has to leave to pick up her children.  Since she is  stable does not need imaging I feel this is appropriate.  Sent in a prescription for Zofran ODT to her pharmacy.  Work note was provided.  She is to return if worsening.  Follow-up with her regular doctor if not improved in 3 days.  Patient is in agreement treatment plan.  Discharged stable condition. ? ? ?  ? ? ?FINAL CLINICAL IMPRESSION(S) / ED DIAGNOSES  ? ?Final diagnoses:  ?Nausea vomiting and diarrhea  ? ? ? ?Rx / DC Orders  ? ?ED Discharge Orders   ? ?      Ordered  ?  ondansetron (ZOFRAN-ODT) 4 MG disintegrating tablet  Every 8 hours PRN       ? 01/16/22 1527  ? ?  ?  ? ?  ? ? ? ?Note:  This document was prepared using Dragon voice recognition software and may include unintentional dictation errors. ? ?  ?Versie Starks, PA-C ?01/16/22 1530 ? ?  ?Duffy Bruce, MD ?01/17/22 2007 ? ?

## 2022-05-03 IMAGING — CR DG CHEST 2V
1 series · 2 of 2 positions shown · non-contrast
Comparison: 02/02/2019.

CLINICAL DATA: Productive, painful cough.  Mid chest pain.

EXAM:
CHEST - 2 VIEW

[Series 1: dg chest 2 view · 0.14mm/px · 2 of 2 slices shown]
[im 1/2]
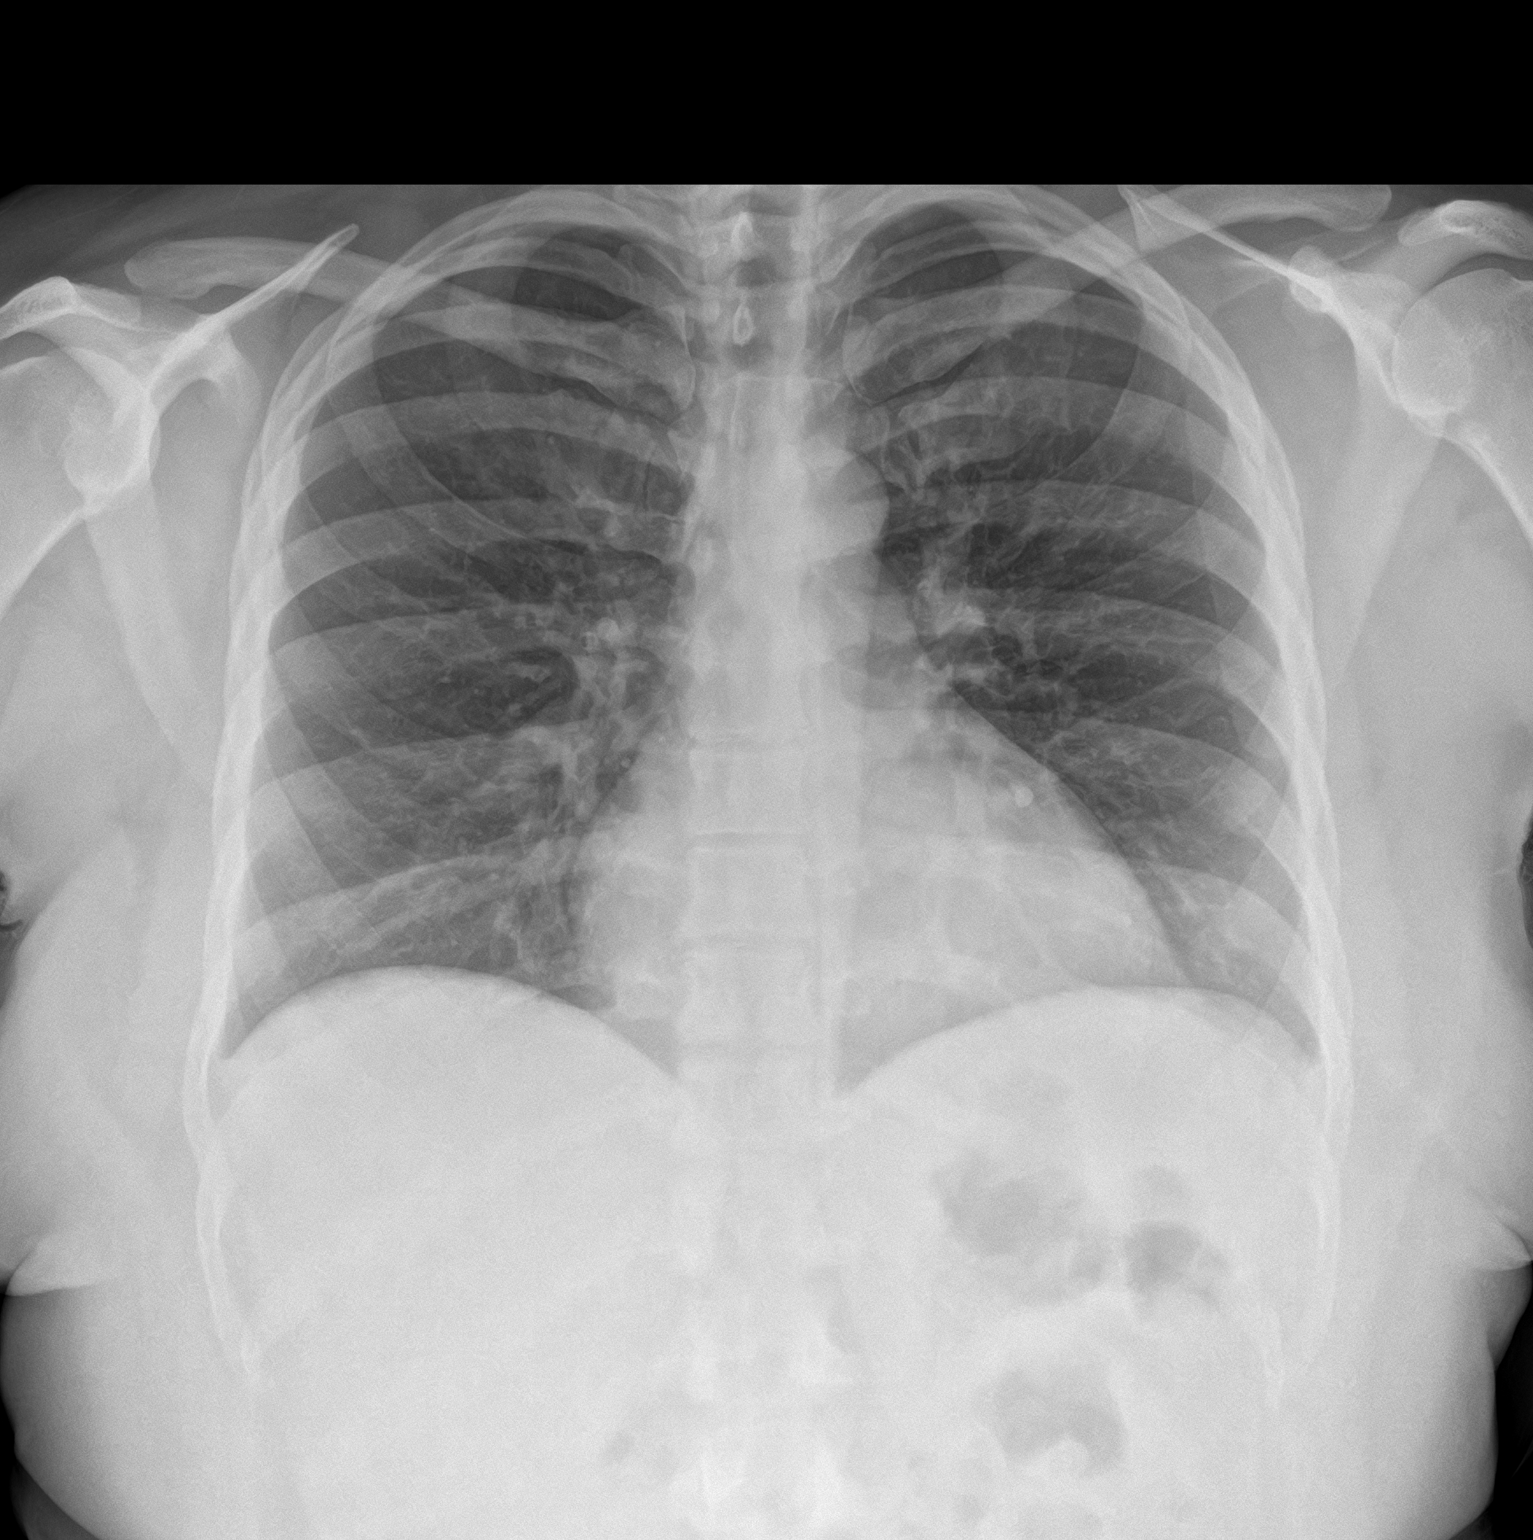
[im 2/2]
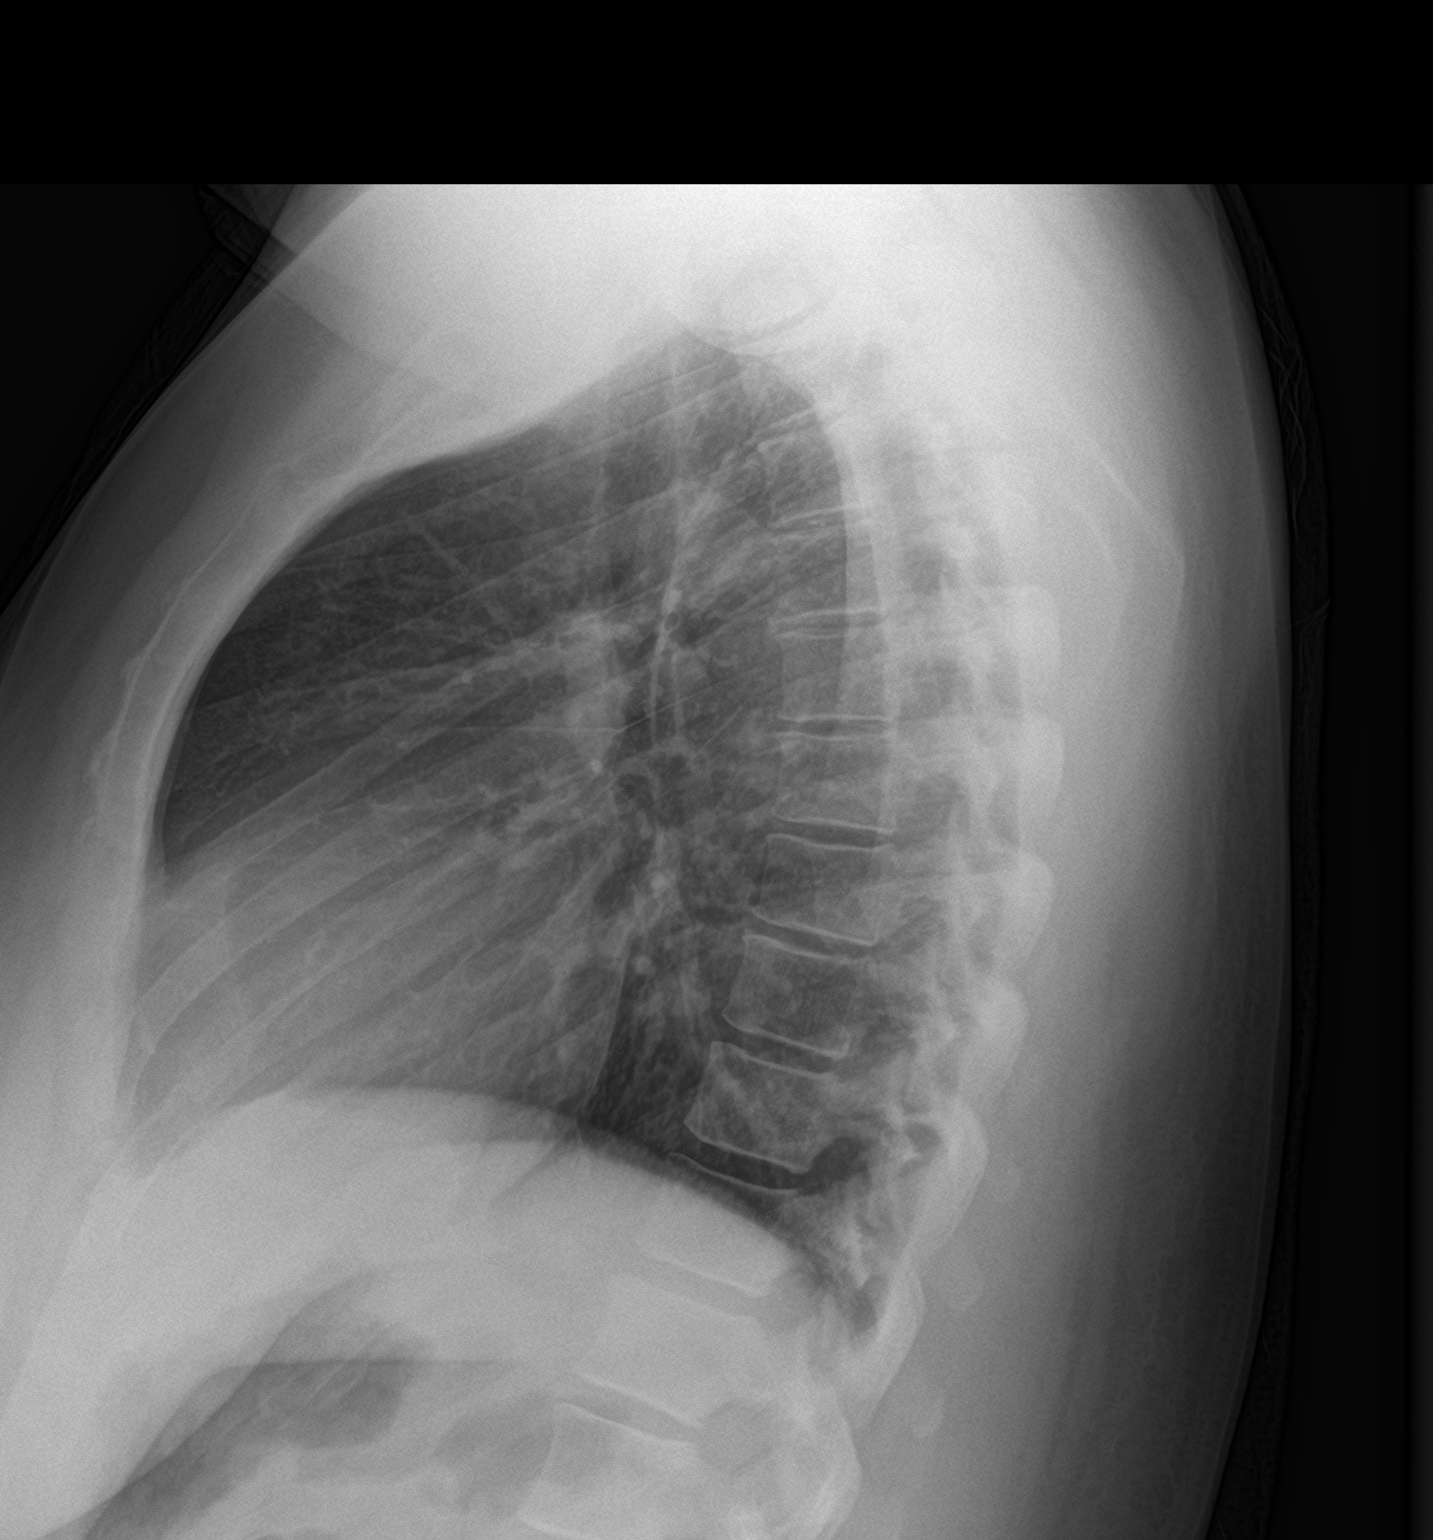

[2 of 2 positions shown; findings below may reference images not displayed]

FINDINGS: Trachea is midline. Heart size stable. Lungs are clear. No pleural
fluid.
IMPRESSION: No acute findings.

## 2024-04-15 ENCOUNTER — Encounter: Payer: Self-pay | Admitting: Nurse Practitioner
